# Patient Record
Sex: Male | Born: 1980 | Hispanic: No | Marital: Married | Smoking: Never smoker
Health system: Southern US, Community
[De-identification: ages and names within clinical notes are randomized; demographics above are authoritative.]

## PROBLEM LIST (undated history)

## (undated) DIAGNOSIS — N200 Calculus of kidney: Secondary | ICD-10-CM

---

## 2011-02-15 ENCOUNTER — Emergency Department: Payer: Self-pay | Admitting: Internal Medicine

## 2012-05-17 DIAGNOSIS — S83519A Sprain of anterior cruciate ligament of unspecified knee, initial encounter: Secondary | ICD-10-CM | POA: Insufficient documentation

## 2012-05-17 DIAGNOSIS — S83249A Other tear of medial meniscus, current injury, unspecified knee, initial encounter: Secondary | ICD-10-CM | POA: Insufficient documentation

## 2012-10-23 DIAGNOSIS — M25569 Pain in unspecified knee: Secondary | ICD-10-CM | POA: Insufficient documentation

## 2014-03-12 ENCOUNTER — Other Ambulatory Visit (HOSPITAL_BASED_OUTPATIENT_CLINIC_OR_DEPARTMENT_OTHER): Payer: Self-pay | Admitting: Physician Assistant

## 2014-03-12 ENCOUNTER — Ambulatory Visit (HOSPITAL_BASED_OUTPATIENT_CLINIC_OR_DEPARTMENT_OTHER)
Admission: RE | Admit: 2014-03-12 | Discharge: 2014-03-12 | Disposition: A | Discharge status: Home / Self Care | Payer: Federal, State, Local not specified - PPO | Source: Ambulatory Visit | Attending: Family Medicine | Admitting: Family Medicine

## 2014-03-12 DIAGNOSIS — N200 Calculus of kidney: Secondary | ICD-10-CM | POA: Diagnosis not present

## 2014-03-12 DIAGNOSIS — R1032 Left lower quadrant pain: Secondary | ICD-10-CM

## 2014-03-12 DIAGNOSIS — R933 Abnormal findings on diagnostic imaging of other parts of digestive tract: Secondary | ICD-10-CM | POA: Diagnosis not present

## 2014-03-12 MED ORDER — IOHEXOL 300 MG/ML  SOLN
100.0000 mL | Freq: Once | INTRAMUSCULAR | Status: AC | PRN
  Administered 2014-03-12: 100 mL via INTRAVENOUS

## 2014-03-12 MED ORDER — IOHEXOL 300 MG/ML  SOLN
50.0000 mL | Freq: Once | INTRAMUSCULAR | Status: AC | PRN
  Administered 2014-03-12: 50 mL via ORAL

## 2015-08-20 DIAGNOSIS — M2391 Unspecified internal derangement of right knee: Secondary | ICD-10-CM | POA: Insufficient documentation

## 2015-08-20 DIAGNOSIS — M25661 Stiffness of right knee, not elsewhere classified: Secondary | ICD-10-CM | POA: Insufficient documentation

## 2015-08-20 DIAGNOSIS — M1711 Unilateral primary osteoarthritis, right knee: Secondary | ICD-10-CM | POA: Insufficient documentation

## 2015-09-01 ENCOUNTER — Encounter (HOSPITAL_COMMUNITY): Payer: Self-pay | Admitting: Emergency Medicine

## 2015-09-01 ENCOUNTER — Emergency Department (HOSPITAL_COMMUNITY)
Admission: EM | Admit: 2015-09-01 | Discharge: 2015-09-01 | Disposition: A | Discharge status: Home / Self Care | Payer: Non-veteran care | Attending: Emergency Medicine | Admitting: Emergency Medicine

## 2015-09-01 DIAGNOSIS — R109 Unspecified abdominal pain: Secondary | ICD-10-CM | POA: Diagnosis present

## 2015-09-01 DIAGNOSIS — R1031 Right lower quadrant pain: Secondary | ICD-10-CM | POA: Insufficient documentation

## 2015-09-01 HISTORY — DX: Calculus of kidney: N20.0

## 2015-09-01 NOTE — ED Notes (Signed)
Bed: WA09 Expected date:  Expected time:  Means of arrival:  Comments: EMS 35 yo Flank pain / Hematuria

## 2015-09-01 NOTE — ED Provider Notes (Signed)
CSN: [id number]     Arrival date & time 09/01/15  6962 History   None    Chief Complaint  Patient presents with  . Flank Pain    HPI Comments: 35 year old male presents with R flank pain since 2:30AM last night. PMH significant for kidney stones. Reports associated fever, chills, N/V, hematuria. He is usually seen at the [place] where he states he is always treated and they have all his records. He has come here today because he was on his way to the [place] when the pain became so severe he began to vomit. EMS was called who transported him here. He states he would not like any work up here and just wants to make sure he is "stable" so he can go to the [place]. He is stating his pain is better and is not feeling nauseous anymore. Denies chest pain, SOB, upper abdominal pain, constipation/diarrhea, testicular pain or swelling.  Patient is a 35 y.o. male presenting with flank pain.  Flank Pain Associated symptoms include abdominal pain, chills, a fever, nausea and vomiting. Pertinent negatives include no chest pain.    Past Medical History  Diagnosis Date  . Kidney stones    History reviewed. No pertinent past surgical history. History reviewed. No pertinent family history. Social History  Substance Use Topics  . Smoking status: Never Smoker   . Smokeless tobacco: Never Used  . Alcohol Use: No    Review of Systems  Constitutional: Positive for fever and chills.  Respiratory: Negative for shortness of breath.   Cardiovascular: Negative for chest pain.  Gastrointestinal: Positive for nausea, vomiting and abdominal pain. Negative for diarrhea, constipation and blood in stool.  Genitourinary: Positive for hematuria and flank pain. Negative for dysuria, scrotal swelling and testicular pain.  All other systems reviewed and are negative.   Allergies  Review of patient's allergies indicates no known allergies.  Home Medications   Prior to Admission medications   Not on File   BP 128/84 mmHg   Pulse 60  Temp(Src) 97.6 F (36.4 C) (Oral)  Resp 20  SpO2 100%   Physical Exam  Constitutional: He is oriented to person, place, and time. He appears well-developed and well-nourished. No distress.  HENT:  Head: Normocephalic and atraumatic.  Eyes: Conjunctivae are normal. Pupils are equal, round, and reactive to light. Right eye exhibits no discharge. Left eye exhibits no discharge. No scleral icterus.  Neck: Normal range of motion.  Cardiovascular: Normal rate and regular rhythm.  Exam reveals no gallop and no friction rub.   No murmur heard. Pulmonary/Chest: Effort normal and breath sounds normal. No respiratory distress. He has no wheezes. He has no rales. He exhibits no tenderness.  Abdominal: Soft. Bowel sounds are normal. He exhibits no distension and no mass. There is tenderness. There is CVA tenderness. There is no rebound and no guarding.  R CVA tenderness, RLQ tenderness  Neurological: He is alert and oriented to person, place, and time.  Skin: Skin is warm and dry.  Psychiatric: He has a normal mood and affect.    ED Course  Procedures (including critical care time) Labs Review Labs Reviewed - No data to display  Imaging Review No results found. I have personally reviewed and evaluated these images and lab results as part of my medical decision-making.   EKG Interpretation None      MDM   Final diagnoses:  Right flank pain   35 year old male who presents with R flank pain  and symptoms consistent with a kidney stone. Patient is afebrile, normotensive. All other vitals are within normal limits. Patient's history and exam are suggestive of a kidney stone however patient is refusing further work up or medications. He is also having RLQ pain. I advised him that I am unable to tell him what exactly is causing his symptoms. We discussed the nature and purpose, risks and benefits, as well as, the alternatives of treatment. Time was given to allow the opportunity to ask  questions and consider their options, and after the discussion, the patient decided to refuse the offerred treatment. Patient has capacity to make their decision and understood that we would not be able to adequately diagnose him or give him a prognosis. I do not suspect that this is a life threatening cause as his vitals are stable and he looks non-toxic, therefore I do not think he has to leave AMA. Patient understood the consequences of leaving without treatment or further work up and still expressed wishes to leave. Patient is NAD, non-toxic, with stable VS. The patient was notified that they may return to the emergency department at any time for further treatment.       Bethel BornKelly Marie Cipriana Biller, PA-C 09/01/15 1202  Dione Boozeavid Glick, MD 09/01/15 [phone]

## 2015-09-01 NOTE — ED Notes (Signed)
Brought in by EMS from home with c/o flank pain.  Per EMS, pt reported that he started having right flank pain at around 0200 this morning.  Pt also reports nausea and vomiting earlier, and hematuria 3 days ago. Marland Kitchen.  Has hx of kidney stones.  Pt refused medications by EMS---- wanted "just to be stabilized and [facility]

## 2015-09-01 NOTE — Discharge Instructions (Signed)
Flank Pain °Flank pain refers to pain that is located on the side of the body between the upper abdomen and the back. The pain may occur over a short period of time (acute) or may be long-term or reoccurring (chronic). It may be mild or severe. Flank pain can be caused by many things. °CAUSES  °Some of the more common causes of flank pain include: °· Muscle strains.   °· Muscle spasms.   °· A disease of your spine (vertebral disk disease).   °· A lung infection (pneumonia).   °· Fluid around your lungs (pulmonary edema).   °· A kidney infection.   °· Kidney stones.   °· A very painful skin rash caused by the chickenpox virus (shingles).   °· Gallbladder disease.   °HOME CARE INSTRUCTIONS  °Home care will depend on the cause of your pain. In general, °· Rest as directed by your caregiver. °· Drink enough fluids to keep your urine clear or pale yellow. °· Only take over-the-counter or prescription medicines as directed by your caregiver. Some medicines may help relieve the pain. °· Tell your caregiver about any changes in your pain. °· Follow up with your caregiver as directed. °SEEK IMMEDIATE MEDICAL CARE IF:  °· Your pain is not controlled with medicine.   °· You have new or worsening symptoms. °· Your pain increases.   °· You have abdominal pain.   °· You have shortness of breath.   °· You have persistent nausea or vomiting.   °· You have swelling in your abdomen.   °· You feel faint or pass out.   °· You have blood in your urine. °· You have a fever or persistent symptoms for more than 2-3 days. °· You have a fever and your symptoms suddenly get worse. °MAKE SURE YOU:  °· Understand these instructions. °· Will watch your condition. °· Will get help right away if you are not doing well or get worse. °  °This information is not intended to replace advice given to you by your health care provider. Make sure you discuss any questions you have with your health care provider. °  °Document Released: 05/29/2005 Document  Revised: 12/31/2011 Document Reviewed: 11/20/2011 °Elsevier Interactive Patient Education ©2016 [employer] ° °

## 2016-11-13 ENCOUNTER — Ambulatory Visit: Payer: Self-pay

## 2016-11-13 ENCOUNTER — Encounter: Payer: Self-pay | Admitting: Podiatry

## 2016-11-13 ENCOUNTER — Ambulatory Visit (INDEPENDENT_AMBULATORY_CARE_PROVIDER_SITE_OTHER): Payer: Federal, State, Local not specified - PPO

## 2016-11-13 ENCOUNTER — Ambulatory Visit (INDEPENDENT_AMBULATORY_CARE_PROVIDER_SITE_OTHER): Payer: Federal, State, Local not specified - PPO | Admitting: Podiatry

## 2016-11-13 DIAGNOSIS — M722 Plantar fascial fibromatosis: Secondary | ICD-10-CM | POA: Diagnosis not present

## 2016-11-13 DIAGNOSIS — M21619 Bunion of unspecified foot: Secondary | ICD-10-CM

## 2016-11-13 DIAGNOSIS — L84 Corns and callosities: Secondary | ICD-10-CM | POA: Diagnosis not present

## 2016-11-13 DIAGNOSIS — M2142 Flat foot [pes planus] (acquired), left foot: Secondary | ICD-10-CM

## 2016-11-13 DIAGNOSIS — M2141 Flat foot [pes planus] (acquired), right foot: Secondary | ICD-10-CM | POA: Diagnosis not present

## 2016-11-13 MED ORDER — MELOXICAM 15 MG PO TABS: 15.0000 mg | ORAL_TABLET | Freq: Every day | ORAL | 2 refills | Status: AC

## 2016-11-13 NOTE — Progress Notes (Signed)
Subjective:    Patient ID: James Delacruz, male   DOB: 36 y.o.   MRN: [id number]   HPI James Delacruz Presents the also concerns her right heel pain which is been ongoing for about 1 month. He states his pain when he first gets S James Delacruz stands backup. He describes as a gradual onset. He does sit in symptoms did worsen 3 change stop having more of a sedentary job. He states that shortly after the pain started he started to have boot camp which does not hurt his foot. He states the pain doesn't get better with activity. No numbness or tingling. Pain does not wake him up at night. He has noticed a hard piece of skin from the ball of his right foot as well. He's had no recent treatment has been tried cutting it off himself. He has no other concerns.  Review of Systems  All other systems reviewed and are negative.       Objective:  Physical Exam General: AAO x3, NAD  Dermatological: Hyperkeratotic lesion present right foot submetatarsal 2. No surrounding edema, erythema, drainage or pus. There are no open sores, no preulcerative lesions, no rash or signs of infection present.  Vascular: Dorsalis Pedis artery and Posterior Tibial artery pedal pulses are 2/4 bilateral with immedate capillary fill time.  There is no pain with calf compression, swelling, warmth, erythema.   Neruologic: Grossly intact via light touch bilateral. Vibratory intact via tuning fork bilateral. Protective threshold with Semmes Wienstein monofilament intact to all pedal sites bilateral.   Musculoskeletal: Tenderness to palpation along the plantar medial tubercle of the calcaneus at the insertion of plantar fascia on the right foot. There is no pain along the course of the plantar fascia within the arch of the foot. Plantar fascia appears to be intact. There is no pain with lateral compression of the calcaneus or pain with vibratory sensation. There is no pain along the course or insertion of the achilles tendon. No other areas of tenderness to  bilateral lower extremities. 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 No pain, crepitus, or limitation noted with foot and ankle range of motion bilateral. Muscular strength 5/5 in all groups tested bilateral.  Gait: Unassisted, Nonantalgic.      Assessment:     36 year old male right heel pain/plantar fasciitis due to biomechanical changes    Plan:  -Treatment options discussed including all alternatives, risks, and complications -Etiology of symptoms were discussed -X-rays were obtained and reviewed with the patient. No significant calcaneal spurring is present. No evidence of acute fracture. -Declined steroid injection. -Prescribed mobic. Discussed side effects of the medication and directed to stop if any are to occur and call the office.  -Stretching, icing exercises discussed -Discussed shoe gear modifications and orthotics. He would like custom orthotics he was molded for them today. -Hopefully the inserts will help with the callus. Which surgery to the area daily. -Follow-up in 3 weeks or sooner if needed. Call any questions or concerns.  Ovid CurdMatthew Sharonda Llamas, DPM

## 2016-11-13 NOTE — Patient Instructions (Signed)

## 2016-11-13 NOTE — Progress Notes (Signed)
   Subjective:    Patient ID: James Delacruz, male    DOB: Nov 07, 1980, 36 y.o.   MRN: [id number]  HPI  I have some right heel pain and has been going on for a while and I have a callus on the bottom of my right foot and I have some coldness due to an injury back in [place]    Review of Systems  Constitutional: Positive for fatigue.  HENT: Positive for sinus pressure.   Gastrointestinal:       Blood in urine   Musculoskeletal: Positive for back pain and gait problem.  All other systems reviewed and are negative.      Objective:   Physical Exam        Assessment & Plan:

## 2016-11-14 ENCOUNTER — Encounter (HOSPITAL_COMMUNITY): Payer: Self-pay | Admitting: Emergency Medicine

## 2016-12-09 ENCOUNTER — Other Ambulatory Visit: Payer: Federal, State, Local not specified - PPO | Admitting: Orthotics

## 2016-12-11 ENCOUNTER — Other Ambulatory Visit: Payer: Self-pay | Admitting: Orthotics

## 2018-01-01 ENCOUNTER — Other Ambulatory Visit: Payer: Self-pay | Admitting: Internal Medicine

## 2018-01-01 DIAGNOSIS — R9389 Abnormal findings on diagnostic imaging of other specified body structures: Secondary | ICD-10-CM

## 2018-01-01 DIAGNOSIS — J189 Pneumonia, unspecified organism: Secondary | ICD-10-CM

## 2018-01-01 DIAGNOSIS — J181 Lobar pneumonia, unspecified organism: Secondary | ICD-10-CM

## 2018-01-11 ENCOUNTER — Other Ambulatory Visit: Payer: Self-pay | Admitting: Podiatry

## 2018-01-11 DIAGNOSIS — M79672 Pain in left foot: Secondary | ICD-10-CM

## 2018-01-13 ENCOUNTER — Ambulatory Visit
Admission: RE | Admit: 2018-01-13 | Discharge: 2018-01-13 | Disposition: A | Discharge status: Home / Self Care | Payer: Federal, State, Local not specified - PPO | Source: Ambulatory Visit | Attending: Podiatry | Admitting: Podiatry

## 2018-01-13 DIAGNOSIS — M79672 Pain in left foot: Secondary | ICD-10-CM

## 2018-10-06 ENCOUNTER — Other Ambulatory Visit: Payer: Self-pay

## 2018-10-06 DIAGNOSIS — R109 Unspecified abdominal pain: Secondary | ICD-10-CM

## 2019-01-11 ENCOUNTER — Telehealth: Payer: Self-pay | Admitting: *Deleted
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
## 2019-01-13 NOTE — Telephone Encounter (Signed)
I informed pt Dr. Jacqualyn Posey stated pt should be evaluated again since last office visit was 2018. Transferred pt to schedulers.

## 2019-02-11 ENCOUNTER — Other Ambulatory Visit: Payer: Self-pay

## 2019-02-11 ENCOUNTER — Ambulatory Visit: Payer: Federal, State, Local not specified - PPO | Admitting: Podiatry

## 2019-02-11 ENCOUNTER — Encounter: Payer: Self-pay | Admitting: Podiatry

## 2019-02-11 ENCOUNTER — Ambulatory Visit: Payer: Federal, State, Local not specified - PPO

## 2019-02-11 DIAGNOSIS — M722 Plantar fascial fibromatosis: Secondary | ICD-10-CM

## 2019-02-11 NOTE — Progress Notes (Signed)
Subjective: 38 year old male presents the office today requesting a letter from medical clinic notes to submit to the department of [facility] [facility] for plantar fasciitis and pes planus.  While he was in [employer] he reports he had several injuries to his feet and ankle sprains.  He feels this is what contributed to his plan fasciitis.  Although he had flatfeet prior to this the symptoms were exacerbated during his [employer].  After I last saw him he did follow-up with Dr. Kayleen Memos in [place], [place] where he had steroid injections as well as stretching, orthotics.  Despite this he is continued have pain.  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  He does not want any other treatments today and only is requesting a letter. Denies any systemic complaints such as fevers, chills, nausea, vomiting. No acute changes since last appointment, and no other complaints at this time.   Objective: AAO x3, NAD DP/PT pulses palpable bilaterally, CRT less than 3 seconds Suggested there is tenderness on the plantar medial tubercle of the calcaneus at insertion upon the fascia.  Crepitus present.  No area pinpoint tenderness.  Ankle, subtalar range of motion intact.  No pain with calf compression, swelling, warmth, erythema  Assessment: Chronic bilateral foot pain  Plan: -All treatment options discussed with the patient including all alternatives, risks, complications.  -Note was provided for him today.  He declined any other further treatment today and declined x-rays. -Patient encouraged to call the office with any questions, concerns, change in symptoms.   Trula Slade DPM

## 2019-02-25 ENCOUNTER — Encounter: Payer: Self-pay | Admitting: Podiatry

## 2019-02-25 NOTE — Progress Notes (Signed)
Requested medical records were placed up front to be mailed out to patient per his request.

## 2019-06-26 ENCOUNTER — Ambulatory Visit
Admission: EM | Admit: 2019-06-26 | Discharge: 2019-06-26 | Disposition: A | Discharge status: Home / Self Care | Payer: Federal, State, Local not specified - PPO | Attending: Physician Assistant | Admitting: Physician Assistant

## 2019-06-26 ENCOUNTER — Ambulatory Visit (INDEPENDENT_AMBULATORY_CARE_PROVIDER_SITE_OTHER): Payer: Federal, State, Local not specified - PPO

## 2019-06-26 ENCOUNTER — Encounter: Payer: Self-pay | Admitting: Physician Assistant

## 2019-06-26 ENCOUNTER — Other Ambulatory Visit: Payer: Self-pay

## 2019-06-26 DIAGNOSIS — M25572 Pain in left ankle and joints of left foot: Secondary | ICD-10-CM

## 2019-06-26 DIAGNOSIS — Y9302 Activity, running: Secondary | ICD-10-CM

## 2019-06-26 DIAGNOSIS — S01111A Laceration without foreign body of right eyelid and periocular area, initial encounter: Secondary | ICD-10-CM | POA: Diagnosis not present

## 2019-06-26 DIAGNOSIS — S0181XA Laceration without foreign body of other part of head, initial encounter: Secondary | ICD-10-CM | POA: Diagnosis not present

## 2019-06-26 DIAGNOSIS — W228XXA Striking against or struck by other objects, initial encounter: Secondary | ICD-10-CM

## 2019-06-26 DIAGNOSIS — W010XXA Fall on same level from slipping, tripping and stumbling without subsequent striking against object, initial encounter: Secondary | ICD-10-CM
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
## 2019-06-26 NOTE — Discharge Instructions (Signed)
Xray to the ankle with possible avulsion fracture, this usually means you sprained your ankle.  Start ibuprofen 400 mg 3 times a day for the next 5 to 7 days.  Ice compress, elevation, ankle brace during activity, rest.  Follow-up with orthopedics for further evaluation if symptoms not improving in the next week.  3sutures placed. You can remove current dressing in 24 hours. Keep wound clean and dry. You can clean gently with soap and water. Do not soak area in water. Monitor for spreading redness, increased warmth, increased swelling, fever, follow up for reevaluation needed. Otherwise follow up in 5 days for suture removal.
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
## 2019-07-01 ENCOUNTER — Ambulatory Visit
Admission: EM | Admit: 2019-07-01 | Discharge: 2019-07-01 | Disposition: A | Discharge status: Home / Self Care | Payer: Federal, State, Local not specified - PPO

## 2019-07-01 ENCOUNTER — Other Ambulatory Visit: Payer: Self-pay

## 2019-07-01 DIAGNOSIS — Z4802 Encounter for removal of sutures: Secondary | ICD-10-CM

## 2019-07-01 NOTE — ED Triage Notes (Signed)
Pt presents for suture removal from right eye brow. 3 sutures removed, site is clean and intact.  Pt complains of worsening left ankle pain from injury he was seen here for on 3/7. Verbal order for crutches and ace wrap from [employer] PA. Pt will follow up with ortho.

## 2020-04-27 ENCOUNTER — Other Ambulatory Visit: Payer: Self-pay

## 2020-04-27 ENCOUNTER — Ambulatory Visit
Admission: EM | Admit: 2020-04-27 | Discharge: 2020-04-27 | Disposition: A | Discharge status: Home / Self Care | Payer: Federal, State, Local not specified - PPO | Attending: Emergency Medicine | Admitting: Emergency Medicine

## 2020-04-27 DIAGNOSIS — J029 Acute pharyngitis, unspecified: Secondary | ICD-10-CM | POA: Insufficient documentation

## 2020-04-27 LAB — POCT RAPID STREP A (OFFICE): Rapid Strep A Screen: NEGATIVE

## 2020-04-27 NOTE — ED Triage Notes (Signed)
Patient presents to Urgent Care with complaints of bilateral ear pain that progressed into sore throat since Tuesday night. Patient is unvaccinated.

## 2020-04-27 NOTE — ED Provider Notes (Signed)
[facility]    CSN: [id number] Arrival date & time: 04/27/20  0840      History   Chief Complaint No chief complaint on file. Ear Pain, sore throat  HPI James Delacruz is a 40 y.o. male presenting today for evaluation of ear pain and sore throat.  Patient reports that over the past 3 to 4 days he has had ear pain and sore throat.  Reports increased sore throat and discomfort over the past 1 to 2 days.  Symptoms worse on the left side.  Reports very mild cough.  Denies significant rhinorrhea or congestion.  Denies fevers or fatigue.  HPI  Past Medical History:  Diagnosis Date  . Kidney stones     Patient Active Problem List   Diagnosis Date Noted  . Plantar fasciitis 11/13/2016  . Internal derangement of right knee 08/20/2015  . Primary osteoarthritis of right knee 08/20/2015  . Pain in unspecified knee 10/23/2012  . ACL tear 05/17/2012  . Medial meniscus tear 05/17/2012    History reviewed. No pertinent surgical history.     Home Medications    Prior to Admission medications   Medication Sig Start Date End Date Taking? Authorizing Provider  tamsulosin (FLOMAX) 0.4 MG CAPS capsule Take 0.4 mg by mouth daily. 01/25/19   [provider]    Family History History reviewed. No pertinent family history.  Social History Social History   Tobacco Use  . Smoking status: Unknown If Ever Smoked  . Smokeless tobacco: Never Used  Substance Use Topics  . Alcohol use: No     Allergies   Patient has no known allergies.   Review of Systems Review of Systems  Constitutional: Negative for activity change, appetite change, chills, fatigue and fever.  HENT: Positive for ear pain and sore throat. Negative for congestion, rhinorrhea, sinus pressure and trouble swallowing.   Eyes: Negative for discharge and redness.  Respiratory: Positive for cough. Negative for chest tightness and shortness of breath.   Cardiovascular: Negative for chest pain.   Gastrointestinal: Negative for abdominal pain, diarrhea, nausea and vomiting.  Musculoskeletal: Negative for myalgias.  Skin: Negative for rash.  Neurological: Negative for dizziness, light-headedness and headaches.     Physical Exam Triage Vital Signs ED Triage Vitals  Enc Vitals Group     BP 04/27/20 0910 126/86     Pulse Rate 04/27/20 0910 90     Resp 04/27/20 0910 17     Temp 04/27/20 0911 99.7 F (37.6 C)     Temp Source 04/27/20 0910 Oral     SpO2 04/27/20 0910 96 %     Weight 04/27/20 0911 194 lb (88 kg)     Height 04/27/20 0911 6\' 1"  (1.854 m)     Head Circumference --      Peak Flow --      Pain Score 04/27/20 0911 6     Pain Loc --      Pain Edu? --      Excl. in GC? --    No data found.  Updated Vital Signs BP 126/86 (BP Location: Right Arm)   Pulse 90   Temp 99.7 F (37.6 C)   Resp 17   Ht 6\' 1"  (1.854 m)   Wt 194 lb (88 kg)   SpO2 96%   BMI 25.60 kg/m   Visual Acuity Right Eye Distance:   Left Eye Distance:   Bilateral Distance:    Right Eye Near:   Left Eye Near:  Bilateral Near:     Physical Exam Vitals and nursing note reviewed.  Constitutional:      Appearance: He is well-developed and well-nourished.     Comments: No acute distress  HENT:     Head: Normocephalic and atraumatic.     Ears:     Comments: Bilateral ears without tenderness to palpation of external auricle, tragus and mastoid, EAC's without erythema or swelling, TM's with good bony landmarks and cone of light. Non erythematous.     Nose: Nose normal.     Mouth/Throat:     Comments: Oral mucosa pink and moist, no tonsillar enlargement or exudate. Posterior pharynx patent and is erythematous, no uvula deviation or swelling. Normal phonation. Eyes:     Conjunctiva/sclera: Conjunctivae normal.  Neck:     Comments: Left tonsillar lymphadenopathy Cardiovascular:     Rate and Rhythm: Normal rate.  Pulmonary:     Effort: Pulmonary effort is normal. No respiratory distress.      Comments: Breathing comfortably at rest, CTABL, no wheezing, rales or other adventitious sounds auscultated Abdominal:     General: There is no distension.  Musculoskeletal:        General: Normal range of motion.     Cervical back: Neck supple.  Skin:    General: Skin is warm and dry.  Neurological:     Mental Status: He is alert and oriented to person, place, and time.  Psychiatric:        Mood and Affect: Mood and affect normal.      UC Treatments / Results  Labs (all labs ordered are listed, but only abnormal results are displayed) Labs Reviewed  CULTURE, GROUP A STREP [facility])  POCT RAPID STREP A (OFFICE)    EKG   Radiology No results found.  Procedures Procedures (including critical care time)  Medications Ordered in UC Medications - No data to display  Initial Impression / Assessment and Plan / UC Course  I have reviewed the triage vital signs and the nursing notes.  Pertinent labs & imaging results that were available during my care of the patient were reviewed by me and considered in my medical decision making (see chart for details).     Strep test negative, strep culture pending, patient declined Covid testing.  Recommending symptomatic and supportive care suspect likely viral etiology.  Rest and fluids.  Discussed strict return precautions. Patient verbalized understanding and is agreeable with plan.  Final Clinical Impressions(s) / UC Diagnoses   Final diagnoses:  Sore throat     Discharge Instructions     Sore Throat  Your rapid strep tested Negative today. We will send for a culture and call in about 2 days if results are positive.  Sore throat likely viral related to postnasal drainage.  Please continue Tylenol or Ibuprofen for fever and pain. May try salt water gargles, cepacol lozenges, throat spray, or OTC cold relief medicine for throat discomfort. If you also have congestion take a daily anti-histamine like Zyrtec, Claritin, and a  oral decongestant to help with post nasal drip that may be irritating your throat.   Stay hydrated and drink plenty of fluids to keep your throat coated relieve irritation.     ED Prescriptions    None     PDMP not reviewed this encounter.   Wieters, [place] C, PA-C 04/27/20 1003

## 2020-04-27 NOTE — Discharge Instructions (Signed)
Sore Throat  Your rapid strep tested Negative today. We will send for a culture and call in about 2 days if results are positive.  Sore throat likely viral related to postnasal drainage.  Please continue Tylenol or Ibuprofen for fever and pain. May try salt water gargles, cepacol lozenges, throat spray, or OTC cold relief medicine for throat discomfort. If you also have congestion take a daily anti-histamine like Zyrtec, Claritin, and a oral decongestant to help with post nasal drip that may be irritating your throat.   Stay hydrated and drink plenty of fluids to keep your throat coated relieve irritation.

## 2020-04-30 LAB — CULTURE, GROUP A STREP (THRC)

## 2020-05-16 ENCOUNTER — Ambulatory Visit
Admission: EM | Admit: 2020-05-16 | Discharge: 2020-05-16 | Disposition: A | Discharge status: Home / Self Care | Payer: Federal, State, Local not specified - PPO | Attending: Urgent Care | Admitting: Urgent Care

## 2020-05-16 ENCOUNTER — Other Ambulatory Visit: Payer: Self-pay

## 2020-05-16 ENCOUNTER — Encounter: Payer: Self-pay | Admitting: Emergency Medicine

## 2020-05-16 ENCOUNTER — Ambulatory Visit (INDEPENDENT_AMBULATORY_CARE_PROVIDER_SITE_OTHER): Payer: Federal, State, Local not specified - PPO

## 2020-05-16 DIAGNOSIS — J189 Pneumonia, unspecified organism: Secondary | ICD-10-CM

## 2020-05-16 DIAGNOSIS — R059 Cough, unspecified: Secondary | ICD-10-CM

## 2020-05-16 MED ORDER — BENZONATATE 100 MG PO CAPS: 100.0000 mg | ORAL_CAPSULE | Freq: Three times a day (TID) | ORAL | 0 refills | Status: DC | PRN

## 2020-05-16 MED ORDER — CEFDINIR 300 MG PO CAPS: 300.0000 mg | ORAL_CAPSULE | Freq: Two times a day (BID) | ORAL | 0 refills | Status: DC

## 2020-05-16 MED ORDER — PROMETHAZINE-DM 6.25-15 MG/5ML PO SYRP: 5.0000 mL | ORAL_SOLUTION | Freq: Every evening | ORAL | 0 refills | Status: DC | PRN

## 2020-05-16 MED ORDER — AZITHROMYCIN 250 MG PO TABS: ORAL_TABLET | ORAL | 0 refills | Status: DC

## 2020-05-16 NOTE — ED Provider Notes (Signed)
  [facility]   MRN: [id number] DOB: 03/18/1981  Subjective:   James Delacruz is a 40 y.o. male presenting for 1 month history of persistent cough, bilateral ear discomfort, scratchy sore throat. Had a negative strep test ~2 weeks ago, negative throat culture. No smoking. History of bronchitis. No lung disorders otherwise. No COVID vaccination.   No current facility-administered medications for this encounter.  Current Outpatient Medications:  .  tamsulosin (FLOMAX) 0.4 MG CAPS capsule, Take 0.4 mg by mouth daily. (Patient not taking: Reported on 05/16/2020), Disp: , Rfl:    No Known Allergies  Past Medical History:  Diagnosis Date  . Kidney stones      History reviewed. No pertinent surgical history.  History reviewed. No pertinent family history.  Social History   Tobacco Use  . Smoking status: Unknown If Ever Smoked  . Smokeless tobacco: Never Used  Substance Use Topics  . Alcohol use: No    ROS   Objective:   Vitals: BP 130/87 (BP Location: Left Arm)   Pulse 87   Temp 98.8 F (37.1 C) (Oral)   Resp 18   SpO2 98%   Physical Exam Constitutional:      General: He is not in acute distress.    Appearance: Normal appearance. He is well-developed. He is not ill-appearing, toxic-appearing or diaphoretic.  HENT:     Head: Normocephalic and atraumatic.     Right Ear: External ear normal.     Left Ear: External ear normal.     Nose: Nose normal.     Mouth/Throat:     Mouth: Mucous membranes are moist.     Pharynx: Oropharynx is clear.  Eyes:     General: No scleral icterus.    Extraocular Movements: Extraocular movements intact.     Pupils: Pupils are equal, round, and reactive to light.  Cardiovascular:     Rate and Rhythm: Normal rate and regular rhythm.     Heart sounds: Normal heart sounds. No murmur heard. No friction rub. No gallop.   Pulmonary:     Effort: Pulmonary effort is normal. No respiratory distress.     Breath sounds: Normal  breath sounds. No stridor. No wheezing, rhonchi or rales.  Neurological:     Mental Status: He is alert and oriented to person, place, and time.  Psychiatric:        Mood and Affect: Mood normal.        Behavior: Behavior normal.        Thought Content: Thought content normal.     DG Chest 2 View  Result Date: 05/16/2020 CLINICAL DATA:  Productive cough EXAM: CHEST - 2 VIEW COMPARISON:  None. FINDINGS: Cardiac and mediastinal contours normal. Vascularity normal. No pleural effusion Question early infiltrate right upper lobe.  Left lung clear. IMPRESSION: Possible early pneumonia right upper lobe. Electronically Signed   By: Marlan Palau M.D.   On: 05/16/2020 10:30   Assessment and Plan :   PDMP not reviewed this encounter.  1. Community acquired pneumonia of right upper lobe of lung   2. Cough     COVID 19 testing pending. Will cover for CAP with azithromycin, cefdinir. Use supportive care otherwise. Counseled patient on potential for adverse effects with medications prescribed/recommended today, ER and return-to-clinic precautions discussed, patient verbalized understanding.    Wallis Bamberg, PA-C 05/16/20 1043

## 2020-05-16 NOTE — ED Triage Notes (Signed)
Pt here for cough x 1 month; pt seen here 1 month ago for sore throat and ear itching; pt sts is improved but still having cough worse at night

## 2020-08-26 ENCOUNTER — Encounter: Payer: Self-pay | Admitting: *Deleted

## 2020-08-26 ENCOUNTER — Ambulatory Visit
Admission: EM | Admit: 2020-08-26 | Discharge: 2020-08-26 | Disposition: A | Discharge status: Home / Self Care | Payer: Federal, State, Local not specified - PPO | Attending: Emergency Medicine | Admitting: Emergency Medicine

## 2020-08-26 ENCOUNTER — Other Ambulatory Visit: Payer: Self-pay

## 2020-08-26 DIAGNOSIS — J111 Influenza due to unidentified influenza virus with other respiratory manifestations: Secondary | ICD-10-CM | POA: Diagnosis not present

## 2020-08-26 DIAGNOSIS — Z20822 Contact with and (suspected) exposure to covid-19: Secondary | ICD-10-CM | POA: Diagnosis not present

## 2020-08-26 LAB — POCT RAPID STREP A (OFFICE): Rapid Strep A Screen: NEGATIVE

## 2020-08-26 MED ORDER — IBUPROFEN 600 MG PO TABS: 600.0000 mg | ORAL_TABLET | Freq: Four times a day (QID) | ORAL | 0 refills | Status: DC | PRN

## 2020-08-26 MED ORDER — BENZONATATE 200 MG PO CAPS: 200.0000 mg | ORAL_CAPSULE | Freq: Three times a day (TID) | ORAL | 0 refills | Status: DC | PRN

## 2020-08-26 MED ORDER — FLUTICASONE PROPIONATE 50 MCG/ACT NA SUSP: 2.0000 | Freq: Every day | NASAL | 0 refills | Status: DC

## 2020-08-26 NOTE — ED Provider Notes (Signed)
HPI  SUBJECTIVE:  James Delacruz is a 40 y.o. male who presents with 4 days of sinus pain, nasal congestion, rhinorrhea, cold sweats, body aches, headaches, sore throat.  He states that he saw a "white spot" on his tonsils yesterday, but this has since resolved.  He reports cough, postnasal drip.  He states he has some diarrhea yesterday, but attributes this to some bad soup.  No fevers, loss of sense of smell or taste, shortness of breath, nausea, vomiting, abdominal pain.  No known COVID exposure.  He was exposed to flu 5 days ago.  He did not get either vaccine.  No allergy symptoms.  No antipyretic in the past 6 hours.  He tried 650 mg of Tylenol every 4 hours, Sudafed and Tylenol Cold and flu without improvement in his symptoms.  No aggravating factors.  Past medical history negative for diabetes, hypertension, pulmonary disease.  PMD: [facility].    Past Medical History:  Diagnosis Date  . Kidney stones     History reviewed. No pertinent surgical history.  Family History  Problem Relation Age of Onset  . Healthy Mother   . Healthy Father     Social History   Tobacco Use  . Smoking status: Never Smoker  . Smokeless tobacco: Never Used  Vaping Use  . Vaping Use: Never used  Substance Use Topics  . Alcohol use: No  . Drug use: Never    No current facility-administered medications for this encounter.  Current Outpatient Medications:  .  benzonatate (TESSALON) 200 MG capsule, Take 1 capsule (200 mg total) by mouth 3 (three) times daily as needed for cough., Disp: 30 capsule, Rfl: 0 .  fluticasone (FLONASE) 50 MCG/ACT nasal spray, Place 2 sprays into both nostrils daily., Disp: 16 g, Rfl: 0 .  ibuprofen (ADVIL) 600 MG tablet, Take 1 tablet (600 mg total) by mouth every 6 (six) hours as needed., Disp: 30 tablet, Rfl: 0  No Known Allergies   ROS  As noted in HPI.   Physical Exam  BP 119/81   Pulse 75   Temp 97.9 F (36.6 C) (Temporal)   Resp 18   SpO2 96%    Constitutional: Well developed, well nourished, no acute distress Eyes:  EOMI, conjunctiva normal bilaterally HENT: Normocephalic, atraumatic,mucus membranes moist positive nasal congestion, erythematous, swollen turbinates.  No maxillary, frontal sinus tenderness.  Erythematous oropharynx, tonsils normal size without exudates.  No obvious postnasal drip.  Uvula midline. Neck: Positive anterior cervical lymphadenopathy Respiratory: Normal inspiratory effort, lungs clear bilaterally Cardiovascular: Normal rate regular rhythm no murmurs GI: nondistended nontender, no splenomegaly skin: No rash, skin intact Musculoskeletal: no deformities Neurologic: Alert & oriented x 3, no focal neuro deficits Psychiatric: Speech and behavior appropriate   ED Course   Medications - No data to display  Orders Placed This Encounter  Procedures  . Culture, group A strep    Standing Status:   Standing    Number of Occurrences:   1  . Covid-19, Flu A+B ([facility])    Standing Status:   Standing    Number of Occurrences:   1  . POCT rapid strep A    Standing Status:   Standing    Number of Occurrences:   1    Results for orders placed or performed during the hospital encounter of 08/26/20 (from the past 24 hour(s))  POCT rapid strep A     Status: None   Collection Time: 08/26/20  8:34 AM  Result Value Ref  Range   Rapid Strep A Screen Negative Negative   No results found.  ED Clinical Impression  1. Influenza-like illness   2. Encounter for laboratory testing for COVID-19 virus      ED Assessment/Plan  Suspect flu.  He is out of the window for Tamiflu.  Strep is negative.  Do not think that this is strep, so culture was not done.  COVID/flu sent.  Home with Tylenol/ibuprofen, Flonase, Mucinex D, Tessalon.  Stop all other cold medications.  Follow-up with PMD as needed, to the ER if he gets worse.  Discussed labs MDM, treatment plan, and plan for follow-up with patient. Discussed sn/sx that  should prompt return to the ED. patient agrees with plan.   Meds ordered this encounter  Medications  . benzonatate (TESSALON) 200 MG capsule    Sig: Take 1 capsule (200 mg total) by mouth 3 (three) times daily as needed for cough.    Dispense:  30 capsule    Refill:  0  . ibuprofen (ADVIL) 600 MG tablet    Sig: Take 1 tablet (600 mg total) by mouth every 6 (six) hours as needed.    Dispense:  30 tablet    Refill:  0  . fluticasone (FLONASE) 50 MCG/ACT nasal spray    Sig: Place 2 sprays into both nostrils daily.    Dispense:  16 g    Refill:  0      *This clinic note was created using Scientist, clinical (histocompatibility and immunogenetics). Therefore, there may be occasional mistakes despite careful proofreading.  ?    Domenick Gong, MD 08/27/20 [phone]

## 2020-08-26 NOTE — ED Triage Notes (Addendum)
C/O cough, nasal congestion, sinus pressure, chills x 2 days without known fevers.  Had body aches yesterday, but has since resolved.  Also reports slight sore throat with whitish lesion to left side of throat. Reports exposure to flu.

## 2020-08-26 NOTE — Discharge Instructions (Addendum)
Your strep was negative.  Your COVID and flu will be back in a day or 2.  Take 600 mg of ibuprofen combined with 1000 mg of Tylenol 3-4 times a day as needed, Mucinex D, Flonase, saline nasal irrigation with a [employer]Med sinus rinse and distilled water as often as you want.  Tessalon for the cough.  Stop other cold medications

## 2020-08-27 LAB — COVID-19, FLU A+B NAA
Influenza A, NAA: DETECTED — AB
Influenza B, NAA: NOT DETECTED
SARS-CoV-2, NAA: NOT DETECTED

## 2020-08-28 ENCOUNTER — Telehealth (HOSPITAL_COMMUNITY): Payer: Self-pay | Admitting: Emergency Medicine

## 2020-08-28 NOTE — Telephone Encounter (Signed)
Patient flu a positive.  Called patient and notified him of results.  He states that he is feeling better, however his daughter is sick with similar symptoms.  Advised him to take her for evaluation if she is not better in a day or 2. AM

## 2020-08-29 LAB — CULTURE, GROUP A STREP (THRC)

## 2021-04-13 ENCOUNTER — Emergency Department (INDEPENDENT_AMBULATORY_CARE_PROVIDER_SITE_OTHER)
Admission: EM | Admit: 2021-04-13 | Discharge: 2021-04-13 | Disposition: A | Discharge status: Home / Self Care | Payer: Federal, State, Local not specified - PPO | Source: Home / Self Care | Attending: Family Medicine | Admitting: Family Medicine

## 2021-04-13 DIAGNOSIS — B9689 Other specified bacterial agents as the cause of diseases classified elsewhere: Secondary | ICD-10-CM

## 2021-04-13 DIAGNOSIS — J329 Chronic sinusitis, unspecified: Secondary | ICD-10-CM | POA: Diagnosis not present

## 2021-04-13 LAB — POCT RAPID STREP A (OFFICE): Rapid Strep A Screen: NEGATIVE

## 2021-04-13 MED ORDER — AMOXICILLIN 875 MG PO TABS: 875.0000 mg | ORAL_TABLET | Freq: Two times a day (BID) | ORAL | 0 refills | Status: DC

## 2021-04-13 NOTE — ED Triage Notes (Signed)
Pt states that he has a cough, scratchy throat and some ear irritation. X2-3 days  Pt states that he isn't vaccinated for covid. Pt states that he hasn't had flu vaccine.

## 2021-04-13 NOTE — Discharge Instructions (Signed)
May continue over-the-counter medicine such as Mucinex DM, and Flonase.  This may help with the drainage of the sinus congestion Take antibiotic 2 times a day.  Take 2 doses today Call for problems

## 2021-04-13 NOTE — ED Provider Notes (Signed)
[facility]    CSN: [id number] Arrival date & time: 04/13/21  0908      History   Chief Complaint Chief Complaint  Patient presents with   Cough    Cough, scratchy throat, and ear irritation. X2-3 days    HPI James Delacruz is a 40 y.o. male.   HPI  Patient states that he has not been sick.  He has had a cough cold and scratchy throat for the last 2 to 3 days.  Prior to this he states he had a different infection.  He states he feels like he has been sick for a couple of weeks.  He is not vaccinated for COVID or flu.  No known exposure to flu or COVID.  He is here to find out if he has anything contagious prior to visiting family on Christmas tomorrow  Past Medical History:  Diagnosis Date   Kidney stones     Patient Active Problem List   Diagnosis Date Noted   Plantar fasciitis 11/13/2016   Internal derangement of right knee 08/20/2015   Primary osteoarthritis of right knee 08/20/2015   Pain in unspecified knee 10/23/2012   ACL tear 05/17/2012   Medial meniscus tear 05/17/2012    History reviewed. No pertinent surgical history.     Home Medications    Prior to Admission medications   Medication Sig Start Date End Date Taking? Authorizing Provider  Acetaminophen (TYLENOL 8 HOUR PO) Take by mouth.   Yes [provider]  amoxicillin (AMOXIL) 875 MG tablet Take 1 tablet (875 mg total) by mouth 2 (two) times daily. 04/13/21  Yes Eustace Moore, MD  fluticasone [facility]) 50 MCG/ACT nasal spray Place 2 sprays into both nostrils daily. 08/26/20   Domenick Gong, MD    Family History Family History  Problem Relation Age of Onset   Healthy Mother    Healthy Father     Social History Social History   Tobacco Use   Smoking status: Never   Smokeless tobacco: Never  Vaping Use   Vaping Use: Never used  Substance Use Topics   Alcohol use: No   Drug use: Never     Allergies   Patient has no known allergies.   Review of  Systems Review of Systems See HPI  Physical Exam Triage Vital Signs ED Triage Vitals  Enc Vitals Group     BP 04/13/21 1023 125/86     Pulse Rate 04/13/21 1023 71     Resp 04/13/21 1023 18     Temp 04/13/21 1023 98.5 F (36.9 C)     Temp Source 04/13/21 1023 Oral     SpO2 04/13/21 1023 97 %     Weight 04/13/21 1020 205 lb (93 kg)     Height 04/13/21 1020 6\' 1"  (1.854 m)     Head Circumference --      Peak Flow --      Pain Score 04/13/21 1020 7     Pain Loc --      Pain Edu? --      Excl. in GC? --    No data found.  Updated Vital Signs BP 125/86 (BP Location: Left Arm)    Pulse 71    Temp 98.5 F (36.9 C) (Oral)    Resp 18    Ht 6\' 1"  (1.854 m)    Wt 93 kg    SpO2 97%    BMI 27.05 kg/m     Physical Exam Constitutional:  General: He is not in acute distress.    Appearance: He is well-developed.  HENT:     Head: Normocephalic and atraumatic.     Right Ear: Tympanic membrane and ear canal normal.     Left Ear: Tympanic membrane and ear canal normal.     Nose: No congestion or rhinorrhea.     Mouth/Throat:     Mouth: Mucous membranes are moist.     Pharynx: Posterior oropharyngeal erythema present.  Eyes:     Conjunctiva/sclera: Conjunctivae normal.     Pupils: Pupils are equal, round, and reactive to light.  Cardiovascular:     Rate and Rhythm: Normal rate and regular rhythm.  Pulmonary:     Effort: Pulmonary effort is normal. No respiratory distress.     Breath sounds: Normal breath sounds.  Abdominal:     General: There is no distension.     Palpations: Abdomen is soft.  Musculoskeletal:        General: Normal range of motion.     Cervical back: Normal range of motion.  Lymphadenopathy:     Cervical: No cervical adenopathy.  Skin:    General: Skin is warm and dry.  Neurological:     Mental Status: He is alert.  Psychiatric:        Mood and Affect: Mood normal.        Behavior: Behavior normal.     UC Treatments / Results  Labs (all labs  ordered are listed, but only abnormal results are displayed) Labs Reviewed  POCT RAPID STREP A (OFFICE) - Normal    EKG   Radiology No results found.  Procedures Procedures (including critical care time)  Medications Ordered in UC Medications - No data to display  Initial Impression / Assessment and Plan / UC Course  I have reviewed the triage vital signs and the nursing notes.  Pertinent labs & imaging results that were available during my care of the patient were reviewed by me and considered in my medical decision making (see chart for details).    Final Clinical Impressions(s) / UC Diagnoses   Final diagnoses:  Sinusitis, bacterial     Discharge Instructions      May continue over-the-counter medicine such as Mucinex DM, and Flonase.  This may help with the drainage of the sinus congestion Take antibiotic 2 times a day.  Take 2 doses today Call for problems   ED Prescriptions     Medication Sig Dispense Auth. Provider   amoxicillin (AMOXIL) 875 MG tablet Take 1 tablet (875 mg total) by mouth 2 (two) times daily. 14 tablet Eustace Moore, MD      PDMP not reviewed this encounter.   Eustace Moore, MD 04/13/21 [phone]

## 2021-05-02 ENCOUNTER — Other Ambulatory Visit: Payer: Self-pay

## 2021-05-02 ENCOUNTER — Emergency Department
Admission: EM | Admit: 2021-05-02 | Discharge: 2021-05-02 | Disposition: A | Discharge status: Home / Self Care | Payer: Federal, State, Local not specified - PPO | Source: Home / Self Care | Attending: Family Medicine | Admitting: Family Medicine

## 2021-05-02 DIAGNOSIS — J0100 Acute maxillary sinusitis, unspecified: Secondary | ICD-10-CM

## 2021-05-02 MED ORDER — AMOXICILLIN-POT CLAVULANATE 875-125 MG PO TABS: 1.0000 | ORAL_TABLET | Freq: Two times a day (BID) | ORAL | 0 refills | Status: DC

## 2021-05-02 NOTE — ED Provider Notes (Signed)
[facility]    CSN: [id number] Arrival date & time: 05/02/21  0802      History   Chief Complaint Chief Complaint  Patient presents with   Facial Pain    left    HPI James Delacruz is a 41 y.o. male.   HPI  Patient is here for sudden onset of sinus symptoms.  He states yesterday he woke up and felt "like I had been punched in the face".  Points to his left cheek area.  States that the pain is in his cheek and radiates down to the back molars.  Felt a little nasal congestion.  No fever or chills, headache or body aches.  No sore throat.  He recently was treated for an upper respiratory infection 04/13/2021.  He was given an antibiotic in case he failed to improve which he did not need.  He has not had antibiotics recently.  He has not had allergies or sinus problems in the past.  This morning he woke up and he still has a facial pain but also had purulent yellow discharge from his left nostril that had blood streaks in it.  He is here for evaluation.  Past Medical History:  Diagnosis Date   Kidney stones     Patient Active Problem List   Diagnosis Date Noted   Plantar fasciitis 11/13/2016   Internal derangement of right knee 08/20/2015   Primary osteoarthritis of right knee 08/20/2015   Pain in unspecified knee 10/23/2012   ACL tear 05/17/2012   Medial meniscus tear 05/17/2012    History reviewed. No pertinent surgical history.     Home Medications    Prior to Admission medications   Medication Sig Start Date End Date Taking? Authorizing Provider  amoxicillin-clavulanate (AUGMENTIN) 875-125 MG tablet Take 1 tablet by mouth every 12 (twelve) hours. 05/02/21  Yes Eustace Moore, MD    Family History Family History  Problem Relation Age of Onset   Healthy Mother    Healthy Father     Social History Social History   Tobacco Use   Smoking status: Never   Smokeless tobacco: Never  Vaping Use   Vaping Use: Never used  Substance Use Topics    Alcohol use: No   Drug use: Never     Allergies   Patient has no known allergies.   Review of Systems Review of Systems See HPI  Physical Exam Triage Vital Signs ED Triage Vitals  Enc Vitals Group     BP 05/02/21 0813 134/87     Pulse Rate 05/02/21 0813 87     Resp 05/02/21 0813 14     Temp 05/02/21 0813 98.5 F (36.9 C)     Temp Source 05/02/21 0813 Oral     SpO2 05/02/21 0813 96 %     Weight 05/02/21 0814 205 lb (93 kg)     Height --      Head Circumference --      Peak Flow --      Pain Score 05/02/21 0814 7     Pain Loc --      Pain Edu? --      Excl. in GC? --    No data found.  Updated Vital Signs BP 134/87 (BP Location: Left Arm)    Pulse 87    Temp 98.5 F (36.9 C) (Oral)    Resp 14    Wt 93 kg    SpO2 96%    BMI 27.05 kg/m  Physical Exam Constitutional:      General: He is not in acute distress.    Appearance: He is well-developed and normal weight.  HENT:     Head: Normocephalic and atraumatic.      Right Ear: Tympanic membrane and ear canal normal.     Left Ear: Tympanic membrane and ear canal normal.     Nose: Nose normal. No rhinorrhea.     Comments: Deviated septum    Mouth/Throat:     Pharynx: Oropharynx is clear. No posterior oropharyngeal erythema.  Eyes:     Conjunctiva/sclera: Conjunctivae normal.     Pupils: Pupils are equal, round, and reactive to light.  Cardiovascular:     Rate and Rhythm: Normal rate.  Pulmonary:     Effort: Pulmonary effort is normal. No respiratory distress.  Abdominal:     General: There is no distension.     Palpations: Abdomen is soft.  Musculoskeletal:        General: Normal range of motion.     Cervical back: Normal range of motion.  Skin:    General: Skin is warm and dry.  Neurological:     Mental Status: He is alert.     UC Treatments / Results  Labs (all labs ordered are listed, but only abnormal results are displayed) Labs Reviewed - No data to display  EKG   Radiology No  results found.  Procedures Procedures (including critical care time)  Medications Ordered in UC Medications - No data to display  Initial Impression / Assessment and Plan / UC Course  I have reviewed the triage vital signs and the nursing notes.  Pertinent labs & imaging results that were available during my care of the patient were reviewed by me and considered in my medical decision making (see chart for details).  Even though of sinus infections are viral, I believe the unilateral sinus infection with purulent discharge is likely bacterial.  Going to cover him with Augmentin.  Discussed increasing fluids.  Providing drainage with Mucinex and Flonase.  Return as needed Final Clinical Impressions(s) / UC Diagnoses   Final diagnoses:  Acute non-recurrent maxillary sinusitis     Discharge Instructions      Make sure that you drink lots of water Take the antibiotic 2 x a day for 7 days Use muicinex if the drainage is thick Use flonase if the nasal passages are swollen Return as needed    ED Prescriptions     Medication Sig Dispense Auth. Provider   amoxicillin-clavulanate (AUGMENTIN) 875-125 MG tablet Take 1 tablet by mouth every 12 (twelve) hours. 14 tablet Eustace Moore, MD      PDMP not reviewed this encounter.   Eustace Moore, MD 05/02/21 [phone]

## 2021-05-02 NOTE — Discharge Instructions (Signed)
Make sure that you drink lots of water Take the antibiotic 2 x a day for 7 days Use muicinex if the drainage is thick Use flonase if the nasal passages are swollen Return as needed

## 2021-05-02 NOTE — ED Triage Notes (Signed)
Pt presents with left side facial pain that began yesterday morning. Pt states he began having thick mucous this morning.

## 2021-07-15 ENCOUNTER — Ambulatory Visit
Admission: EM | Admit: 2021-07-15 | Discharge: 2021-07-15 | Disposition: A | Discharge status: Home / Self Care | Payer: No Typology Code available for payment source | Attending: Physician Assistant | Admitting: Physician Assistant

## 2021-07-15 ENCOUNTER — Ambulatory Visit (INDEPENDENT_AMBULATORY_CARE_PROVIDER_SITE_OTHER): Payer: No Typology Code available for payment source

## 2021-07-15 ENCOUNTER — Other Ambulatory Visit: Payer: Self-pay

## 2021-07-15 DIAGNOSIS — J209 Acute bronchitis, unspecified: Secondary | ICD-10-CM

## 2021-07-15 DIAGNOSIS — H6982 Other specified disorders of Eustachian tube, left ear: Secondary | ICD-10-CM

## 2021-07-15 DIAGNOSIS — R059 Cough, unspecified: Secondary | ICD-10-CM

## 2021-07-15 MED ORDER — PREDNISONE 20 MG PO TABS: 40.0000 mg | ORAL_TABLET | Freq: Every day | ORAL | 0 refills | Status: AC

## 2021-07-15 NOTE — Discharge Instructions (Signed)
?  Please follow up with Primary Care as scheduled.  ? ? ?

## 2021-07-15 NOTE — ED Triage Notes (Signed)
59mo h/o cough and left ear pressure. Notes some runny nose and post nasal drip.  Has tried one dose of cough syrup w/o relief. No v/d. ?

## 2021-07-15 NOTE — ED Provider Notes (Signed)
?[facility] ? ? ? ?CSN: [id number] ?Arrival date & time: 07/15/21  1101 ? ? ?  ? ?History   ?Chief Complaint ?Chief Complaint  ?Patient presents with  ? Cough  ? ? ?HPI ?James Delacruz is a 41 y.o. male.  ? ?Patient here today for evaluation of cough he has had the last 2 months. He also reports development of left ear pressure. He has had some runny nose and PND. He has tried one dose of cough syrup without significant relief. He has not had any vomiting or diarrhea.  ? ?He also notes some pain that will wrap around his right flank, abdomen, groin at times. This seems to occur with lifting his leg in sitting position rather than with activity. He reports that he has had a few episodes of his legs going numb while walking as well. He does not report any known injury.  ? ?The history is provided by the patient.  ? ?Past Medical History:  ?Diagnosis Date  ? Kidney stones   ? ? ?Patient Active Problem List  ? Diagnosis Date Noted  ? Plantar fasciitis 11/13/2016  ? Internal derangement of right knee 08/20/2015  ? Primary osteoarthritis of right knee 08/20/2015  ? Pain in unspecified knee 10/23/2012  ? ACL tear 05/17/2012  ? Medial meniscus tear 05/17/2012  ? ? ?History reviewed. No pertinent surgical history. ? ? ? ? ?Home Medications   ? ?Prior to Admission medications   ?Medication Sig Start Date End Date Taking? Authorizing Provider  ?predniSONE (DELTASONE) 20 MG tablet Take 2 tablets (40 mg total) by mouth daily with breakfast for 5 days. 07/15/21 07/20/21 Yes Tomi Bamberger, PA-C  ?amoxicillin-clavulanate (AUGMENTIN) 875-125 MG tablet Take 1 tablet by mouth every 12 (twelve) hours. 05/02/21   Eustace Moore, MD  ? ? ?Family History ?Family History  ?Problem Relation Age of Onset  ? Healthy Mother   ? Healthy Father   ? ? ?Social History ?Social History  ? ?Tobacco Use  ? Smoking status: Never  ? Smokeless tobacco: Never  ?Vaping Use  ? Vaping Use: Never used  ?Substance Use Topics  ? Alcohol use: No  ?  Drug use: Never  ? ? ? ?Allergies   ?Patient has no known allergies. ? ? ?Review of Systems ?Review of Systems  ?Constitutional:  Negative for chills and fever.  ?HENT:  Positive for congestion, postnasal drip and rhinorrhea. Negative for ear pain and sore throat.   ?Eyes:  Negative for discharge and redness.  ?Respiratory:  Positive for cough. Negative for shortness of breath.   ?Gastrointestinal:  Negative for abdominal pain, nausea and vomiting.  ?Musculoskeletal:  Positive for back pain and myalgias.  ?Neurological:  Positive for numbness. Negative for weakness.  ? ? ?Physical Exam ?Triage Vital Signs ?ED Triage Vitals  ?Enc Vitals Group  ?   BP   ?   Pulse   ?   Resp   ?   Temp   ?   Temp src   ?   SpO2   ?   Weight   ?   Height   ?   Head Circumference   ?   Peak Flow   ?   Pain Score   ?   Pain Loc   ?   Pain Edu?   ?   Excl. in GC?   ? ?No data found. ? ?Updated Vital Signs ?BP 127/81 (BP Location: Left Arm)   Pulse 68  Temp 98.6 ?F (37 ?C) (Oral)   Resp 18   SpO2 98%  ? ?   ? ?Physical Exam ?Vitals and nursing note reviewed.  ?Constitutional:   ?   General: He is not in acute distress. ?   Appearance: Normal appearance. He is not ill-appearing.  ?HENT:  ?   Head: Normocephalic and atraumatic.  ?   Left Ear: Tympanic membrane normal.  ?   Nose: Nose normal. No congestion.  ?Eyes:  ?   Conjunctiva/sclera: Conjunctivae normal.  ?Cardiovascular:  ?   Rate and Rhythm: Normal rate and regular rhythm.  ?   Heart sounds: Normal heart sounds. No murmur heard. ?Pulmonary:  ?   Effort: Pulmonary effort is normal. No respiratory distress.  ?   Breath sounds: Normal breath sounds. No wheezing, rhonchi or rales.  ?Musculoskeletal:  ?   Comments: No TTP to midline spine  ?Skin: ?   General: Skin is warm and dry.  ?Neurological:  ?   Mental Status: He is alert.  ?Psychiatric:     ?   Mood and Affect: Mood normal.     ?   Thought Content: Thought content normal.  ? ? ? ?UC Treatments / Results  ?Labs ?(all labs ordered  are listed, but only abnormal results are displayed) ?Labs Reviewed - No data to display ? ?EKG ? ? ?Radiology ?DG Chest 2 View ? ?Result Date: 07/15/2021 ?CLINICAL DATA:  Cough over the last 2 months EXAM: CHEST - 2 VIEW COMPARISON:  05/16/2020 FINDINGS: Chronic mild prominence the right heart border. Hazy density in the right upper chest appears to be relatively symmetric and probably due to soft tissues of the chest wall rather than a pulmonary abnormality. The streaky opacity in this vicinity shown on the prior exam has resolved. The lungs appear otherwise clear. No blunting of the costophrenic angles. No substantial bony abnormality observed. IMPRESSION: 1. No significant intrapulmonary abnormality is currently identified. 2. Chronic mild prominence the right atrial border, which can sometimes be associated with valvular disease, atrial septal defect, or raised right ventricular pressures-consider echocardiography. Electronically Signed   By: Gaylyn Rong M.D.   On: 07/15/2021 11:45   ? ?Procedures ?Procedures (including critical care time) ? ?Medications Ordered in UC ?Medications - No data to display ? ?Initial Impression / Assessment and Plan / UC Course  ?I have reviewed the triage vital signs and the nursing notes. ? ?Pertinent labs & imaging results that were available during my care of the patient were reviewed by me and considered in my medical decision making (see chart for details). ? ?  ?CXR without pulmonary findings- will treat to cover bronchitis. Some questionable finding on Xray- discussed with patient and recommended further evaluation by PCP. Discussed that lower back issues may also be alleviated by steroid burst but recommended follow up with any further concerns.  He does note he has follow up at [place] (his PCP) in about 3 weeks.  ? ?Final Clinical Impressions(s) / UC Diagnoses  ? ?Final diagnoses:  ?Acute bronchitis, unspecified organism  ?Dysfunction of left eustachian tube   ? ? ? ?Discharge Instructions   ? ?  ? ?Please follow up with Primary Care as scheduled.  ? ? ? ? ? ? ?ED Prescriptions   ? ? Medication Sig Dispense Auth. Provider  ? predniSONE (DELTASONE) 20 MG tablet Take 2 tablets (40 mg total) by mouth daily with breakfast for 5 days. 10 tablet Tomi Bamberger, PA-C  ? ?  ? ?  PDMP not reviewed this encounter. ?  ?Tomi BambergerMyers, Artie Takayama F, PA-C ?07/15/21 1351 ? ?

## 2021-08-19 DIAGNOSIS — K219 Gastro-esophageal reflux disease without esophagitis: Secondary | ICD-10-CM | POA: Insufficient documentation

## 2021-08-19 DIAGNOSIS — H903 Sensorineural hearing loss, bilateral: Secondary | ICD-10-CM | POA: Insufficient documentation

## 2021-08-25 DIAGNOSIS — H6123 Impacted cerumen, bilateral: Secondary | ICD-10-CM | POA: Insufficient documentation

## 2022-04-05 ENCOUNTER — Ambulatory Visit
Admission: RE | Admit: 2022-04-05 | Discharge: 2022-04-05 | Disposition: A | Discharge status: Home / Self Care | Payer: Federal, State, Local not specified - PPO | Source: Ambulatory Visit

## 2022-04-05 ENCOUNTER — Ambulatory Visit (INDEPENDENT_AMBULATORY_CARE_PROVIDER_SITE_OTHER): Payer: Federal, State, Local not specified - PPO

## 2022-04-05 ENCOUNTER — Other Ambulatory Visit: Payer: Self-pay

## 2022-04-05 VITALS — BP 115/75 | HR 60 | Temp 98.4°F | Resp 20 | Ht 73.0 in | Wt 210.0 lb

## 2022-04-05 DIAGNOSIS — R0989 Other specified symptoms and signs involving the circulatory and respiratory systems: Secondary | ICD-10-CM | POA: Diagnosis not present

## 2022-04-05 DIAGNOSIS — R059 Cough, unspecified: Secondary | ICD-10-CM | POA: Diagnosis not present

## 2022-04-05 DIAGNOSIS — J0100 Acute maxillary sinusitis, unspecified: Secondary | ICD-10-CM

## 2022-04-05 MED ORDER — PROMETHAZINE-DM 6.25-15 MG/5ML PO SYRP: 5.0000 mL | ORAL_SOLUTION | Freq: Two times a day (BID) | ORAL | 0 refills | Status: DC | PRN

## 2022-04-05 MED ORDER — PREDNISONE 20 MG PO TABS: ORAL_TABLET | ORAL | 0 refills | Status: DC

## 2022-04-05 MED ORDER — DOXYCYCLINE HYCLATE 100 MG PO CAPS: 100.0000 mg | ORAL_CAPSULE | Freq: Two times a day (BID) | ORAL | 0 refills | Status: AC

## 2022-04-05 MED ORDER — BENZONATATE 200 MG PO CAPS: 200.0000 mg | ORAL_CAPSULE | Freq: Three times a day (TID) | ORAL | 0 refills | Status: AC | PRN

## 2022-04-05 NOTE — ED Triage Notes (Signed)
Pt presents to Urgent Care with c/o cough, sore throat, and nasal congestion x 9 days. Concerned due to having pneumonia 1-2 years ago.

## 2022-04-05 NOTE — Discharge Instructions (Addendum)
Advised patient to take medication as directed with food to completion.  Advised patient to take prednisone with first dose of Doxycycline for the next 5 of 7 days.  Advised may take Tessalon daily or as needed for cough.  Advised may take Promethazine DM at night prior to sleep for cough due to sedate of effects.  Advised do not use cough medications together.  Encouraged patient to increase daily water intake to 64 ounces per day while taking these medications.

## 2022-04-05 NOTE — ED Provider Notes (Signed)
[facility]    CSN: [id number] Arrival date & time: 04/05/22  1138      History   Chief Complaint Chief Complaint  Patient presents with   Cough   Sore Throat   Nasal Congestion   1200 appt    HPI James Delacruz is a 41 y.o. male.   HPI 41 year old male presents with cough, sore throat, nasal congestion for 9 days.  Concern of having pneumonia 1 to 2 years ago.  PMH significant for obesity and kidney stones.  Past Medical History:  Diagnosis Date   Kidney stones     Patient Active Problem List   Diagnosis Date Noted   Plantar fasciitis 11/13/2016   Internal derangement of right knee 08/20/2015   Primary osteoarthritis of right knee 08/20/2015   Pain in unspecified knee 10/23/2012   ACL tear 05/17/2012   Medial meniscus tear 05/17/2012    History reviewed. No pertinent surgical history.     Home Medications    Prior to Admission medications   Medication Sig Start Date End Date Taking? Authorizing Provider  benzonatate (TESSALON) 200 MG capsule Take 1 capsule (200 mg total) by mouth 3 (three) times daily as needed for up to 7 days. 04/05/22 04/12/22 Yes Trevor Iha, FNP  doxycycline (VIBRAMYCIN) 100 MG capsule Take 1 capsule (100 mg total) by mouth 2 (two) times daily for 7 days. 04/05/22 04/12/22 Yes Trevor Iha, FNP  predniSONE (DELTASONE) 20 MG tablet Take 3 tabs PO daily x 5 days. 04/05/22  Yes Trevor Iha, FNP  promethazine-dextromethorphan (PROMETHAZINE-DM) 6.25-15 MG/5ML syrup Take 5 mLs by mouth 2 (two) times daily as needed for cough. 04/05/22  Yes Trevor Iha, FNP    Family History Family History  Problem Relation Age of Onset   Healthy Mother    Healthy Father     Social History Social History   Tobacco Use   Smoking status: Never   Smokeless tobacco: Never  Vaping Use   Vaping Use: Never used  Substance Use Topics   Alcohol use: No   Drug use: Never     Allergies   Patient has no known allergies.   Review  of Systems Review of Systems  HENT:  Positive for congestion and sore throat.   Respiratory:  Positive for cough.   All other systems reviewed and are negative.    Physical Exam Triage Vital Signs ED Triage Vitals  Enc Vitals Group     BP 04/05/22 1238 115/75     Pulse Rate 04/05/22 1238 60     Resp 04/05/22 1238 20     Temp 04/05/22 1238 98.4 F (36.9 C)     Temp Source 04/05/22 1238 Oral     SpO2 04/05/22 1238 95 %     Weight 04/05/22 1235 210 lb (95.3 kg)     Height 04/05/22 1235 6\' 1"  (1.854 m)     Head Circumference --      Peak Flow --      Pain Score 04/05/22 1235 3     Pain Loc --      Pain Edu? --      Excl. in GC? --    No data found.  Updated Vital Signs BP 115/75   Pulse 60   Temp 98.4 F (36.9 C) (Oral)   Resp 20   Ht 6\' 1"  (1.854 m)   Wt 210 lb (95.3 kg)   SpO2 95%   BMI 27.71 kg/m   Physical Exam Vitals and  nursing note reviewed.  Constitutional:      Appearance: Normal appearance. He is obese. He is ill-appearing.  HENT:     Head: Normocephalic and atraumatic.     Right Ear: Tympanic membrane and external ear normal.     Left Ear: Tympanic membrane and external ear normal.     Ears:     Comments: Significant eustachian tube dysfunction noted bilaterally    Mouth/Throat:     Mouth: Mucous membranes are moist.     Pharynx: Oropharynx is clear.  Eyes:     Extraocular Movements: Extraocular movements intact.     Conjunctiva/sclera: Conjunctivae normal.     Pupils: Pupils are equal, round, and reactive to light.  Cardiovascular:     Rate and Rhythm: Normal rate and regular rhythm.     Pulses: Normal pulses.     Heart sounds: Normal heart sounds. No murmur heard. Pulmonary:     Effort: Pulmonary effort is normal.     Breath sounds: Normal breath sounds. No wheezing, rhonchi or rales.     Comments: Frequent nonproductive cough noted on exam Musculoskeletal:        General: Normal range of motion.     Cervical back: Normal range of motion  and neck supple. No tenderness.  Lymphadenopathy:     Cervical: No cervical adenopathy.  Skin:    General: Skin is warm and dry.  Neurological:     General: No focal deficit present.     Mental Status: He is alert and oriented to person, place, and time. Mental status is at baseline.      UC Treatments / Results  Labs (all labs ordered are listed, but only abnormal results are displayed) Labs Reviewed - No data to display  EKG   Radiology DG Chest 2 View  Result Date: 04/05/2022 CLINICAL DATA:  Cough and chest congestion for the past 9 days. EXAM: CHEST - 2 VIEW COMPARISON:  None Available. FINDINGS: The heart size and mediastinal contours are within normal limits. Both lungs are clear. The visualized skeletal structures are unremarkable. IMPRESSION: No active cardiopulmonary disease. Electronically Signed   By: Beckie Salts M.D.   On: 04/05/2022 14:08    Procedures Procedures (including critical care time)  Medications Ordered in UC Medications - No data to display  Initial Impression / Assessment and Plan / UC Course  I have reviewed the triage vital signs and the nursing notes.  Pertinent labs & imaging results that were available during my care of the patient were reviewed by me and considered in my medical decision making (see chart for details).     MDM: 1.  Acute maxillary sinusitis, recurrence not specified-Rx'd Doxycycline; 2.  Cough-CXR revealed above, Rx'd prednisone, Tessalon, Promethazine DM. Advised patient to take medication as directed with food to completion.  Advised patient to take prednisone with first dose of Doxycycline for the next 5 of 7 days.  Advised may take Tessalon daily or as needed for cough.  Advised may take Promethazine DM at night prior to sleep for cough due to sedate of effects.  Advised do not use cough medications together.  Encouraged patient to increase daily water intake to 64 ounces per day while taking these medications. Final  Clinical Impressions(s) / UC Diagnoses   Final diagnoses:  Acute maxillary sinusitis, recurrence not specified  Cough, unspecified type     Discharge Instructions      Advised patient to take medication as directed with food to completion.  Advised patient  to take prednisone with first dose of Doxycycline for the next 5 of 7 days.  Advised may take Tessalon daily or as needed for cough.  Advised may take Promethazine DM at night prior to sleep for cough due to sedate of effects.  Advised do not use cough medications together.  Encouraged patient to increase daily water intake to 64 ounces per day while taking these medications.     ED Prescriptions     Medication Sig Dispense Auth. Provider   doxycycline (VIBRAMYCIN) 100 MG capsule Take 1 capsule (100 mg total) by mouth 2 (two) times daily for 7 days. 20 capsule Trevor Iha, FNP   predniSONE (DELTASONE) 20 MG tablet Take 3 tabs PO daily x 5 days. 15 tablet Trevor Iha, FNP   benzonatate (TESSALON) 200 MG capsule Take 1 capsule (200 mg total) by mouth 3 (three) times daily as needed for up to 7 days. 40 capsule Trevor Iha, FNP   promethazine-dextromethorphan (PROMETHAZINE-DM) 6.25-15 MG/5ML syrup Take 5 mLs by mouth 2 (two) times daily as needed for cough. 118 mL Trevor Iha, FNP      PDMP not reviewed this encounter.   Trevor Iha, FNP 04/05/22 1422

## 2022-07-27 ENCOUNTER — Emergency Department (HOSPITAL_COMMUNITY): Payer: No Typology Code available for payment source

## 2022-07-27 ENCOUNTER — Encounter (HOSPITAL_COMMUNITY): Payer: Self-pay

## 2022-07-27 ENCOUNTER — Other Ambulatory Visit: Payer: Self-pay

## 2022-07-27 ENCOUNTER — Emergency Department (HOSPITAL_COMMUNITY)
Admission: EM | Admit: 2022-07-27 | Discharge: 2022-07-27 | Disposition: A | Discharge status: Home / Self Care | Payer: No Typology Code available for payment source | Attending: Emergency Medicine | Admitting: Emergency Medicine

## 2022-07-27 DIAGNOSIS — E876 Hypokalemia: Secondary | ICD-10-CM | POA: Diagnosis not present

## 2022-07-27 DIAGNOSIS — N200 Calculus of kidney: Secondary | ICD-10-CM | POA: Diagnosis not present

## 2022-07-27 DIAGNOSIS — R1031 Right lower quadrant pain: Secondary | ICD-10-CM | POA: Diagnosis present

## 2022-07-27 LAB — URINALYSIS, ROUTINE W REFLEX MICROSCOPIC
Bacteria, UA: NONE SEEN
Bilirubin Urine: NEGATIVE
Glucose, UA: NEGATIVE mg/dL
Ketones, ur: NEGATIVE mg/dL
Leukocytes,Ua: NEGATIVE
Nitrite: NEGATIVE
Protein, ur: 30 mg/dL — AB
RBC / HPF: >50 RBC/hpf (ref 0–5)
Specific Gravity, Urine: >1.046 — ABNORMAL HIGH (ref 1.005–1.030)
pH: 6 (ref 5.0–8.0)

## 2022-07-27 LAB — CBC
HCT: 46.1 % (ref 39.0–52.0)
Hemoglobin: 15.7 g/dL (ref 13.0–17.0)
MCH: 28.9 pg (ref 26.0–34.0)
MCHC: 34.1 g/dL (ref 30.0–36.0)
MCV: 84.9 fL (ref 80.0–100.0)
Platelets: 213 10*3/uL (ref 150–400)
RBC: 5.43 MIL/uL (ref 4.22–5.81)
RDW: 12 % (ref 11.5–15.5)
WBC: 8.2 10*3/uL (ref 4.0–10.5)
nRBC: 0 % (ref 0.0–0.2)

## 2022-07-27 LAB — COMPREHENSIVE METABOLIC PANEL
ALT: 17 U/L (ref 0–44)
AST: 16 U/L (ref 15–41)
Albumin: 3.1 g/dL — ABNORMAL LOW (ref 3.5–5.0)
Alkaline Phosphatase: 55 U/L (ref 38–126)
Anion gap: 8 (ref 5–15)
BUN: 11 mg/dL (ref 6–20)
CO2: 21 mmol/L — ABNORMAL LOW (ref 22–32)
Calcium: 7.3 mg/dL — ABNORMAL LOW (ref 8.9–10.3)
Chloride: 111 mmol/L (ref 98–111)
Creatinine, Ser: 1.08 mg/dL (ref 0.61–1.24)
GFR, Estimated: >60 mL/min (ref >60)
Glucose, Bld: 137 mg/dL — ABNORMAL HIGH (ref 70–99)
Potassium: 3 mmol/L — ABNORMAL LOW (ref 3.5–5.1)
Sodium: 140 mmol/L (ref 135–145)
Total Bilirubin: 0.8 mg/dL (ref 0.3–1.2)
Total Protein: 5.7 g/dL — ABNORMAL LOW (ref 6.5–8.1)

## 2022-07-27 LAB — LIPASE, BLOOD: Lipase: 34 U/L (ref 11–51)

## 2022-07-27 MED ORDER — ONDANSETRON HCL 4 MG/2ML IJ SOLN: 4.0000 mg | Freq: Once | INTRAMUSCULAR | Status: DC

## 2022-07-27 MED ORDER — IOHEXOL 350 MG/ML SOLN
75.0000 mL | Freq: Once | INTRAVENOUS | Status: AC | PRN
  Administered 2022-07-27: 75 mL via INTRAVENOUS

## 2022-07-27 MED ORDER — SODIUM CHLORIDE 0.9 % IV BOLUS: 1000.0000 mL | Freq: Once | INTRAVENOUS | Status: DC

## 2022-07-27 MED ORDER — KETOROLAC TROMETHAMINE 30 MG/ML IJ SOLN: 30.0000 mg | Freq: Once | INTRAMUSCULAR | Status: DC

## 2022-07-27 MED ORDER — POTASSIUM CHLORIDE CRYS ER 20 MEQ PO TBCR
40.0000 meq | EXTENDED_RELEASE_TABLET | Freq: Once | ORAL | Status: AC
  Administered 2022-07-27: 40 meq via ORAL
  Filled 2022-07-27: qty 2

## 2022-07-27 MED ORDER — TAMSULOSIN HCL 0.4 MG PO CAPS: 0.4000 mg | ORAL_CAPSULE | Freq: Two times a day (BID) | ORAL | 0 refills | Status: DC

## 2022-07-27 NOTE — Discharge Instructions (Signed)
You have a 4 mm right-sided kidney stone that is the probable cause of your symptoms.  Your urine does not appear infected.  Your potassium level was a bit low at 3.0.  This could be due in part to diarrhea from recent antibiotic use.  Please increase your fluid intake.  Please follow-up with the urology office if you do not pass the stone in the next 48 hours.  Return for new or worsening symptoms.

## 2022-07-27 NOTE — ED Provider Notes (Signed)
[facility] Emergency Department Provider Note MRN:  [id number]  Arrival date & time: 07/27/22     Chief Complaint   Abdominal Pain   History of Present Illness   James Delacruz is a 42 y.o. year-old male presents to the ED with chief complaint of right lower quad abdominal pain for the past 2 days.  He rates his pain as a 9 out of 10.  He reports associated nausea.  Denies fever, chills, vomiting.  Denies urinary symptoms.  States that he has been taking amoxicillin for sinus infection.  He reports associated diarrhea from amoxicillin.  History provided by patient.   Review of Systems  Pertinent positive and negative review of systems noted in HPI.    Physical Exam   Vitals:   07/27/22 0100 07/27/22 0130  BP: 115/76 119/81  Pulse: 75 74  Resp: 14 16  Temp:    SpO2: 99% 96%    CONSTITUTIONAL:  Non toxic-appearing, NAD NEURO:  Alert and oriented x 3, CN 3-12 grossly intact EYES:  eyes equal and reactive ENT/NECK:  Supple, no stridor  CARDIO:  normal rate, regular rhythm, appears well-perfused  PULM:  No respiratory distress, CTAB GI/GU:  non-distended, RLQ TTP MSK/SPINE:  No gross deformities, no edema, moves all extremities  SKIN:  no rash, atraumatic   *Additional and/or pertinent findings included in MDM below  Diagnostic and Interventional Summary    EKG Interpretation  Date/Time:    Ventricular Rate:    PR Interval:    QRS Duration:   QT Interval:    QTC Calculation:   R Axis:     Text Interpretation:         Labs Reviewed  COMPREHENSIVE METABOLIC PANEL - Abnormal; Notable for the following components:      Result Value   Potassium 3.0 (*)    CO2 21 (*)    Glucose, Bld 137 (*)    Calcium 7.3 (*)    Total Protein 5.7 (*)    Albumin 3.1 (*)    All other components within normal limits  URINALYSIS, ROUTINE W REFLEX MICROSCOPIC - Abnormal; Notable for the following components:   APPearance HAZY (*)    Specific Gravity, Urine  >1.046 (*)    Hgb urine dipstick LARGE (*)    Protein, ur 30 (*)    All other components within normal limits  LIPASE, BLOOD  CBC    CT ABDOMEN PELVIS W CONTRAST  Final Result      Medications  potassium chloride SA (KLOR-CON M) CR tablet 40 mEq (has no administration in time range)  iohexol (OMNIPAQUE) 350 MG/ML injection 75 mL (75 mLs Intravenous Contrast Given 07/27/22 0254)     Procedures  /  Critical Care Procedures  ED Course and Medical Decision Making  I have reviewed the triage vital signs, the nursing notes, and pertinent available records from the EMR.  Social Determinants Affecting Complexity of Care: Patient has no clinically significant social determinants affecting this chief complaint..   ED Course: Clinical Course as of 07/27/22 0404  Sun Jul 27, 2022  0041 Patient offered pain medication, but declined. [RB]    Clinical Course User Index [RB] Roxy Horseman, PA-C    Medical Decision Making Patient here with right-sided lower abdominal pain that started 2 days ago.  Has been gradually worsening.  Reports nausea, but denies any vomiting.  He states that he has had some diarrhea, but associates this with recent antibiotic use for sinus infection.  He is fairly tender on exam.  Will check CT and labs.  Patient offered pain medicine, but he declined.  Declines outpatient pain medicine.  Amount and/or Complexity of Data Reviewed Labs: ordered.    Details: Potassium was 3.0, replaced in ED No leukocytosis Urinalysis noted for RBCs Radiology: ordered and independent interpretation performed.    Details: Right-sided ureteral stone present  Risk Prescription drug management.     Consultants: No consultations were needed in caring for this patient.   Treatment and Plan: Emergency department workup does not suggest an emergent condition requiring admission or immediate intervention beyond  what has been performed at this time. The patient is safe for  discharge and has  been instructed to return immediately for worsening symptoms, change in  symptoms or any other concerns    Final Clinical Impressions(s) / ED Diagnoses     ICD-10-CM   1. Kidney stone  N20.0     2. Hypokalemia  E87.6       ED Discharge Orders          Ordered    tamsulosin (FLOMAX) 0.4 MG CAPS capsule  2 times daily        07/27/22 0401              Discharge Instructions Discussed with and Provided to Patient:     Discharge Instructions      You have a 4 mm right-sided kidney stone that is the probable cause of your symptoms.  Your urine does not appear infected.  Your potassium level was a bit low at 3.0.  This could be due in part to diarrhea from recent antibiotic use.  Please increase your fluid intake.  Please follow-up with the urology office if you do not pass the stone in the next 48 hours.  Return for new or worsening symptoms.       Roxy Horseman, PA-C 07/27/22 0405    Sabas Sous, MD 07/28/22 [id number]

## 2022-07-27 NOTE — ED Triage Notes (Signed)
Pt to ED by POV from home with intermittent RLQ abdominal pain which began yesterday. Pt endorses nausea and diarrhea, no vomiting. Arrives A+O, VSS.

## 2022-08-15 ENCOUNTER — Emergency Department (HOSPITAL_COMMUNITY)
Admission: EM | Admit: 2022-08-15 | Discharge: 2022-08-16 | Disposition: A | Discharge status: Home / Self Care | Payer: No Typology Code available for payment source | Attending: Emergency Medicine | Admitting: Emergency Medicine

## 2022-08-15 ENCOUNTER — Other Ambulatory Visit: Payer: Self-pay

## 2022-08-15 ENCOUNTER — Emergency Department (HOSPITAL_COMMUNITY): Payer: No Typology Code available for payment source

## 2022-08-15 DIAGNOSIS — R109 Unspecified abdominal pain: Secondary | ICD-10-CM | POA: Diagnosis present

## 2022-08-15 DIAGNOSIS — N200 Calculus of kidney: Secondary | ICD-10-CM | POA: Diagnosis not present

## 2022-08-15 LAB — CBC
HCT: 45.3 % (ref 39.0–52.0)
Hemoglobin: 16 g/dL (ref 13.0–17.0)
MCH: 29.3 pg (ref 26.0–34.0)
MCHC: 35.3 g/dL (ref 30.0–36.0)
MCV: 83 fL (ref 80.0–100.0)
Platelets: 203 10*3/uL (ref 150–400)
RBC: 5.46 MIL/uL (ref 4.22–5.81)
RDW: 11.9 % (ref 11.5–15.5)
WBC: 10 10*3/uL (ref 4.0–10.5)
nRBC: 0 % (ref 0.0–0.2)

## 2022-08-15 LAB — URINALYSIS, ROUTINE W REFLEX MICROSCOPIC
Bacteria, UA: NONE SEEN
RBC / HPF: >50 RBC/hpf (ref 0–5)

## 2022-08-15 LAB — COMPREHENSIVE METABOLIC PANEL
ALT: 21 U/L (ref 0–44)
AST: 22 U/L (ref 15–41)
Albumin: 4.1 g/dL (ref 3.5–5.0)
Alkaline Phosphatase: 70 U/L (ref 38–126)
Anion gap: 11 (ref 5–15)
BUN: 11 mg/dL (ref 6–20)
CO2: 24 mmol/L (ref 22–32)
Calcium: 9.2 mg/dL (ref 8.9–10.3)
Chloride: 101 mmol/L (ref 98–111)
Creatinine, Ser: 1.17 mg/dL (ref 0.61–1.24)
GFR, Estimated: >60 mL/min (ref >60)
Glucose, Bld: 143 mg/dL — ABNORMAL HIGH (ref 70–99)
Potassium: 3.9 mmol/L (ref 3.5–5.1)
Sodium: 136 mmol/L (ref 135–145)
Total Bilirubin: 1 mg/dL (ref 0.3–1.2)
Total Protein: 7.1 g/dL (ref 6.5–8.1)

## 2022-08-15 LAB — LIPASE, BLOOD: Lipase: 42 U/L (ref 11–51)

## 2022-08-15 MED ORDER — IOHEXOL 350 MG/ML SOLN
75.0000 mL | Freq: Once | INTRAVENOUS | Status: AC | PRN
  Administered 2022-08-15: 75 mL via INTRAVENOUS

## 2022-08-15 MED ORDER — ONDANSETRON HCL 4 MG/2ML IJ SOLN
4.0000 mg | Freq: Once | INTRAMUSCULAR | Status: AC
  Administered 2022-08-15: 4 mg via INTRAVENOUS
  Filled 2022-08-15: qty 2

## 2022-08-15 MED ORDER — HYDROMORPHONE HCL 1 MG/ML IJ SOLN
1.0000 mg | Freq: Once | INTRAMUSCULAR | Status: AC
  Administered 2022-08-15: 1 mg via INTRAVENOUS
  Filled 2022-08-15: qty 1

## 2022-08-15 NOTE — ED Provider Notes (Signed)
[place] EMERGENCY DEPARTMENT AT [facility] Provider Note   CSN: [id number] Arrival date & time: 08/15/22  2102     History {Add pertinent medical, surgical, social history, OB history to HPI:1} Chief Complaint  Patient presents with   Abdominal Pain    James Delacruz is a 42 y.o. male.  HPI   Patient with history of kidney stones presented with complaints of right-sided flank pain.  Started earlier today, came on suddenly, states he feels some amount of pain above his bladder, states he had difficulty urinating, the pain has subsided in his bladder is now going back into his right flank, states he has associated nausea vomiting, still passing gas having normal bowel movements, states he has hematuria and dysuria, no associated fevers chills cough congestion.  Patient states that he had kidney stones in the past but this feels worse than usual.  States  he was just diagnosed with a  kidney stone 2 weeks ago but not seen the urologist, he has been taking Flomax.  Reviewed patient's chart was seen on 04/07, diagnosed with a right-sided 4 mm kidney stone mid ureter with mild hydronephrosis.  Home Medications Prior to Admission medications   Medication Sig Start Date End Date Taking? Authorizing Provider  amoxicillin (AMOXIL) 500 MG tablet Take 500 mg by mouth 2 (two) times daily.    [provider]  cholecalciferol 25 MCG (1000 UT) tablet Take 1,000 Units by mouth daily.    [provider]  Multiple Vitamins-Minerals (ZINC PO) Take 1 tablet by mouth daily.    [provider]  predniSONE (DELTASONE) 20 MG tablet Take 3 tabs PO daily x 5 days. Patient not taking: Reported on 07/27/2022 04/05/22   Trevor Iha, FNP  promethazine-dextromethorphan (PROMETHAZINE-DM) 6.25-15 MG/5ML syrup Take 5 mLs by mouth 2 (two) times daily as needed for cough. Patient not taking: Reported on 07/27/2022 04/05/22   Trevor Iha, FNP  tamsulosin (FLOMAX) 0.4 MG CAPS  capsule Take 1 capsule (0.4 mg total) by mouth 2 (two) times daily. 07/27/22   Roxy Horseman, PA-C      Allergies    Patient has no known allergies.    Review of Systems   Review of Systems  Constitutional:  Negative for chills and fever.  Respiratory:  Negative for shortness of breath.   Cardiovascular:  Negative for chest pain.  Gastrointestinal:  Positive for abdominal pain and vomiting.  Genitourinary:  Positive for difficulty urinating, dysuria and flank pain.  Neurological:  Negative for headaches.    Physical Exam Updated Vital Signs BP (!) 140/96 (BP Location: Right Arm)   Pulse 71   Temp 98.6 F (37 C) (Oral)   Resp 16   Ht 6\' 1"  (1.854 m)   Wt 95.3 kg   SpO2 99%   BMI 27.72 kg/m  Physical Exam Vitals and nursing note reviewed.  Constitutional:      General: He is not in acute distress.    Appearance: He is not ill-appearing.  HENT:     Head: Normocephalic and atraumatic.     Nose: No congestion.  Eyes:     Conjunctiva/sclera: Conjunctivae normal.  Cardiovascular:     Rate and Rhythm: Normal rate and regular rhythm.     Pulses: Normal pulses.     Heart sounds: No murmur heard.    No friction rub. No gallop.  Pulmonary:     Effort: No respiratory distress.     Breath sounds: No wheezing, rhonchi or rales.  Abdominal:     Palpations: Abdomen is soft.     Tenderness: There is abdominal tenderness. There is right CVA tenderness. There is no left CVA tenderness.     Comments: Abdomen nondistended, soft, noted tenderness in the right flank and right CVA tenderness, no guarding rebound tenderness or peritoneal sign, there is no notable suprapubic distention or fullness on my examination.  Skin:    General: Skin is warm and dry.  Neurological:     Mental Status: He is alert.  Psychiatric:        Mood and Affect: Mood normal.     ED Results / Procedures / Treatments   Labs (all labs ordered are listed, but only abnormal results are displayed) Labs  Reviewed  CBC  LIPASE, BLOOD  COMPREHENSIVE METABOLIC PANEL  URINALYSIS, ROUTINE W REFLEX MICROSCOPIC    EKG None  Radiology No results found.  Procedures Procedures  {Document cardiac monitor, telemetry assessment procedure when appropriate:1}  Medications Ordered in ED Medications  ondansetron (ZOFRAN) injection 4 mg (has no administration in time range)  HYDROmorphone (DILAUDID) injection 1 mg (has no administration in time range)    ED Course/ Medical Decision Making/ A&P   {   Click here for ABCD2, HEART and other calculatorsREFRESH Note before signing :1}                          Medical Decision Making Risk Prescription drug management.   This patient presents to the ED for concern of right-sided flank pain, this involves an extensive number of treatment options, and is a complaint that carries with it a high risk of complications and morbidity.  The differential diagnosis includes kidney stone, pyelo-, UTI, appendicitis, AAA, dissection    Additional history obtained:  Additional history obtained from partner at bedside External records from outside source obtained and reviewed including recent ER note   Co morbidities that complicate the patient evaluation  Kidney stones  Social Determinants of Health:  N/a    Lab Tests:  I Ordered, and personally interpreted labs.  The pertinent results include: CBC unremarkable   Imaging Studies ordered:  I ordered imaging studies including CT abdomen pelvis I independently visualized and interpreted imaging which showed *** I agree with the radiologist interpretation   Cardiac Monitoring:  The patient was maintained on a cardiac monitor.  I personally viewed and interpreted the cardiac monitored which showed an underlying rhythm of: ***   Medicines ordered and prescription drug management:  I ordered medication including pain pain medication, fluids I have reviewed the patients home medicines and  have made adjustments as needed  Critical Interventions:  ***   Reevaluation:  Presents with abdominal pain, will obtain lab workup and imaging for further evaluation.    Consultations Obtained:  I requested consultation with the ***,  and discussed lab and imaging findings as well as pertinent plan - they recommend: ***    Test Considered:  ***    Rule out ****    Dispostion and problem list  After consideration of the diagnostic results and the patients response to treatment, I feel that the patent would benefit from ***.       {Document critical care time when appropriate:1} {Document review of labs and clinical decision tools ie heart score, Chads2Vasc2 etc:1}  {Document your independent review of radiology images, and any outside records:1} {Document your discussion with family members, caretakers, and with consultants:1} {Document social determinants of health affecting  pt's care:1} {Document your decision making why or why not admission, treatments were needed:1} Final Clinical Impression(s) / ED Diagnoses Final diagnoses:  None    Rx / DC Orders ED Discharge Orders     None

## 2022-08-15 NOTE — ED Triage Notes (Signed)
The pt has  a diagnosis of a  kidney stone and he has not been able to pass it  he has had this for a week  he has been unable to void since 1899

## 2022-08-16 MED ORDER — OXYCODONE-ACETAMINOPHEN 5-325 MG PO TABS: 1.0000 | ORAL_TABLET | Freq: Three times a day (TID) | ORAL | 0 refills | Status: AC | PRN

## 2022-08-16 MED ORDER — ONDANSETRON HCL 4 MG PO TABS: 4.0000 mg | ORAL_TABLET | Freq: Four times a day (QID) | ORAL | 0 refills | Status: DC

## 2022-08-16 MED ORDER — TAMSULOSIN HCL 0.4 MG PO CAPS: 0.4000 mg | ORAL_CAPSULE | Freq: Every day | ORAL | 0 refills | Status: AC

## 2022-08-16 NOTE — Discharge Instructions (Signed)
You have a 6 mm right-sided kidney stone, it is in the middle of your ureter, start you on Flomax this medication will help dilate your ureter to help pass the stone, this medication can lower your blood pressure if you start feel lightheaded or dizziness while take this medication please discontinue.  I recommend over-the-counter pain medication as needed for pain.  I have given you Zofran this will help with nausea.  I have given you a short course of narcotics please take as prescribed.  This medication can make you drowsy do not consume alcohol or operate heavy machinery when taking this medication.  This medication is Tylenol in it do not take Tylenol and take this medication.   Please follow-up with urology  If you have worsening symptoms, you develop fevers chills, uncontrolled nausea vomiting or pain despite the medication I have given you or you are unable to urinate please come back in for reassessment.

## 2023-03-12 ENCOUNTER — Ambulatory Visit: Payer: Federal, State, Local not specified - PPO

## 2023-03-12 ENCOUNTER — Other Ambulatory Visit: Payer: Self-pay

## 2023-03-12 ENCOUNTER — Telehealth: Payer: Self-pay | Admitting: Family Medicine

## 2023-03-12 ENCOUNTER — Ambulatory Visit
Admission: EM | Admit: 2023-03-12 | Discharge: 2023-03-12 | Disposition: A | Discharge status: Home / Self Care | Payer: Federal, State, Local not specified - PPO | Attending: Family Medicine | Admitting: Family Medicine

## 2023-03-12 ENCOUNTER — Encounter: Payer: Self-pay | Admitting: Emergency Medicine

## 2023-03-12 DIAGNOSIS — J189 Pneumonia, unspecified organism: Secondary | ICD-10-CM

## 2023-03-12 MED ORDER — DOXYCYCLINE HYCLATE 100 MG PO CAPS: 100.0000 mg | ORAL_CAPSULE | Freq: Two times a day (BID) | ORAL | 0 refills | Status: AC

## 2023-03-12 NOTE — Discharge Instructions (Signed)
You were seen today for continued cough.  I am worried about a possible pneumonia on your xray.  I have sent out an antibiotic to treat this.  If the radiologist reads this differently you will be notified.  You will also see this on mychart.  Please return if not improving or worsening.

## 2023-03-12 NOTE — ED Triage Notes (Signed)
Pt reports productive cough x6 weeks. Yellow sputum. Noticed small amounts of blood in sputum x3 weeks.

## 2023-03-12 NOTE — ED Provider Notes (Signed)
[facility]    CSN: [id number] Arrival date & time: 03/12/23  4098      History   Chief Complaint Chief Complaint  Patient presents with   Cough    HPI James Delacruz is a 42 y.o. male.    Cough Associated symptoms: no shortness of breath and no wheezing    Patient is here for a cough x 6 weeks.  It was worse initially the first 2 weeks, but has lingered.  No fevers.  He does have h/o pneumonia in the past with lingering cough.  He is using flonase and saline solution.  No runny nose, congestion.        Past Medical History:  Diagnosis Date   Kidney stones     Patient Active Problem List   Diagnosis Date Noted   Plantar fasciitis 11/13/2016   Internal derangement of right knee 08/20/2015   Primary osteoarthritis of right knee 08/20/2015   Pain in unspecified knee 10/23/2012   ACL tear 05/17/2012   Medial meniscus tear 05/17/2012    History reviewed. No pertinent surgical history.     Home Medications    Prior to Admission medications   Medication Sig Start Date End Date Taking? Authorizing Provider  Multiple Vitamins-Minerals (ZINC PO) Take 1 tablet by mouth daily.   Yes [provider]  amoxicillin (AMOXIL) 500 MG tablet Take 500 mg by mouth 2 (two) times daily. Patient not taking: Reported on 03/12/2023    [provider]  cholecalciferol 25 MCG (1000 UT) tablet Take 1,000 Units by mouth daily. Patient not taking: Reported on 03/12/2023    [provider]  ondansetron (ZOFRAN) 4 MG tablet Take 1 tablet (4 mg total) by mouth every 6 (six) hours. Patient not taking: Reported on 03/12/2023 08/16/22   Carroll Sage, PA-C  predniSONE (DELTASONE) 20 MG tablet Take 3 tabs PO daily x 5 days. Patient not taking: Reported on 07/27/2022 04/05/22   Trevor Iha, FNP  promethazine-dextromethorphan (PROMETHAZINE-DM) 6.25-15 MG/5ML syrup Take 5 mLs by mouth 2 (two) times daily as needed for cough. Patient not taking:  Reported on 07/27/2022 04/05/22   Trevor Iha, FNP    Family History Family History  Problem Relation Age of Onset   Healthy Mother    Healthy Father     Social History Social History   Tobacco Use   Smoking status: Never   Smokeless tobacco: Never  Vaping Use   Vaping status: Never Used  Substance Use Topics   Alcohol use: No   Drug use: Never     Allergies   Patient has no known allergies.   Review of Systems Review of Systems  Constitutional: Negative.   HENT: Negative.    Respiratory:  Positive for cough. Negative for shortness of breath and wheezing.   Cardiovascular: Negative.   Gastrointestinal: Negative.   Genitourinary: Negative.   Musculoskeletal: Negative.   Psychiatric/Behavioral: Negative.       Physical Exam Triage Vital Signs ED Triage Vitals  Encounter Vitals Group     BP 03/12/23 1105 116/76     Systolic BP Percentile --      Diastolic BP Percentile --      Pulse Rate 03/12/23 1105 71     Resp 03/12/23 1105 16     Temp 03/12/23 1105 98.2 F (36.8 C)     Temp Source 03/12/23 1105 Oral     SpO2 03/12/23 1105 95 %     Weight 03/12/23 1106 211 lb (95.7  kg)     Height 03/12/23 1106 6\' 1"  (1.854 m)     Head Circumference --      Peak Flow --      Pain Score 03/12/23 1106 1     Pain Loc --      Pain Education --      Exclude from Growth Chart --    No data found.  Updated Vital Signs BP 116/76 (BP Location: Left Arm)   Pulse 71   Temp 98.2 F (36.8 C) (Oral)   Resp 16   Ht 6\' 1"  (1.854 m)   Wt 95.7 kg   SpO2 95%   BMI 27.84 kg/m   Visual Acuity Right Eye Distance:   Left Eye Distance:   Bilateral Distance:    Right Eye Near:   Left Eye Near:    Bilateral Near:     Physical Exam Constitutional:      Appearance: Normal appearance.  HENT:     Nose: Nose normal.  Cardiovascular:     Rate and Rhythm: Normal rate and regular rhythm.  Pulmonary:     Effort: Pulmonary effort is normal.     Breath sounds: Normal breath  sounds.  Musculoskeletal:     Cervical back: Normal range of motion and neck supple. No tenderness.  Lymphadenopathy:     Cervical: No cervical adenopathy.  Skin:    General: Skin is warm.  Neurological:     General: No focal deficit present.     Mental Status: He is alert.  Psychiatric:        Mood and Affect: Mood normal.      UC Treatments / Results  Labs (all labs ordered are listed, but only abnormal results are displayed) Labs Reviewed - No data to display  EKG   Radiology No results found.  Procedures Procedures (including critical care time)  Medications Ordered in UC Medications - No data to display  Initial Impression / Assessment and Plan / UC Course  I have reviewed the triage vital signs and the nursing notes.  Pertinent labs & imaging results that were available during my care of the patient were reviewed by me and considered in my medical decision making (see chart for details).   Final Clinical Impressions(s) / UC Diagnoses   Final diagnoses:  Pneumonia due to infectious organism, unspecified laterality, unspecified part of lung     Discharge Instructions      You were seen today for continued cough.  I am worried about a possible pneumonia on your xray.  I have sent out an antibiotic to treat this.  If the radiologist reads this differently you will be notified.  You will also see this on mychart.  Please return if not improving or worsening.     ED Prescriptions     Medication Sig Dispense Auth. Provider   doxycycline (VIBRAMYCIN) 100 MG capsule Take 1 capsule (100 mg total) by mouth 2 (two) times daily. 20 capsule Jannifer Franklin, MD      PDMP not reviewed this encounter.   Jannifer Franklin, MD 03/12/23 [phone]

## 2023-03-12 NOTE — Telephone Encounter (Signed)
Spoke with patient in regards to normal results.  Advised that he may start the doxy for presumed bronchitis instead.  He is aware.

## 2023-03-12 NOTE — ED Notes (Signed)
Called patient to come back to room/note states he is waiting in his car.  Pt answered his phone and stated that he had to leave for a minute because he vomited.

## 2023-04-25 ENCOUNTER — Ambulatory Visit
Admission: EM | Admit: 2023-04-25 | Discharge: 2023-04-25 | Disposition: A | Discharge status: Home / Self Care | Payer: Federal, State, Local not specified - PPO

## 2023-04-25 DIAGNOSIS — J329 Chronic sinusitis, unspecified: Secondary | ICD-10-CM

## 2023-04-25 DIAGNOSIS — J40 Bronchitis, not specified as acute or chronic: Secondary | ICD-10-CM | POA: Diagnosis not present

## 2023-04-25 NOTE — ED Provider Notes (Signed)
 [facility]    CSN: [id number] Arrival date & time: 04/25/23  1306      History   Chief Complaint Chief Complaint  Patient presents with   Sinus Problem    Family of 2    HPI James Delacruz is a 43 y.o. male.   Patient here today for evaluation of sinus congestion.  He reports he is concerned for possible sinus infection developing.  He notes that he has had a cough for several months and that he was having more sinus pressure however after he was seen for same several weeks ago symptoms resolved and he did not take antibiotics prescribed.  He questions if doxycycline  would be appropriate to start now.  He has not had any fever.  The history is provided by the patient.  Sinus Problem Pertinent negatives include no abdominal pain and no shortness of breath.    Past Medical History:  Diagnosis Date   Kidney stones     Patient Active Problem List   Diagnosis Date Noted   Bilateral impacted cerumen 08/25/2021   Gastroesophageal reflux disease 08/19/2021   Sensorineural hearing loss (SNHL) of both ears 08/19/2021   Plantar fasciitis 11/13/2016   Internal derangement of right knee 08/20/2015   Primary osteoarthritis of right knee 08/20/2015   Stiffness of right knee 08/20/2015   Pain in unspecified knee 10/23/2012   Knee pain 10/23/2012   ACL tear 05/17/2012   Medial meniscus tear 05/17/2012    History reviewed. No pertinent surgical history.     Home Medications    Prior to Admission medications   Medication Sig Start Date End Date Taking? Authorizing Provider  predniSONE  (DELTASONE ) 20 MG tablet Take 20 mg by mouth as directed. 08/19/21  Yes [provider]  tamsulosin  (FLOMAX ) 0.4 MG CAPS capsule Take 0.4 mg by mouth daily. 12/31/22  Yes [provider]  doxycycline  (VIBRAMYCIN ) 100 MG capsule Take 1 capsule (100 mg total) by mouth 2 (two) times daily. Patient taking differently: Take 100 mg by mouth 2 (two) times daily. Did not take it.  03/12/23   Piontek, Rocky, MD  Multiple Vitamins-Minerals (ZINC PO) Take 1 tablet by mouth daily.    [provider]    Family History Family History  Problem Relation Age of Onset   Healthy Mother    Healthy Father     Social History Social History   Tobacco Use   Smoking status: Never    Passive exposure: Never   Smokeless tobacco: Never  Vaping Use   Vaping status: Never Used  Substance Use Topics   Alcohol use: No   Drug use: Never     Allergies   Patient has no known allergies.   Review of Systems Review of Systems  Constitutional:  Negative for chills and fever.  HENT:  Positive for congestion and sore throat. Negative for ear pain.   Eyes:  Negative for discharge and redness.  Respiratory:  Positive for cough. Negative for shortness of breath.   Gastrointestinal:  Negative for abdominal pain, nausea and vomiting.     Physical Exam Triage Vital Signs ED Triage Vitals  Encounter Vitals Group     BP 04/25/23 1425 113/74     Systolic BP Percentile --      Diastolic BP Percentile --      Pulse Rate 04/25/23 1425 80     Resp 04/25/23 1425 18     Temp 04/25/23 1425 98 F (36.7 C)  Temp Source 04/25/23 1425 Oral     SpO2 04/25/23 1425 96 %     Weight 04/25/23 1423 210 lb (95.3 kg)     Height 04/25/23 1423 6' 1 (1.854 m)     Head Circumference --      Peak Flow --      Pain Score 04/25/23 1422 0     Pain Loc --      Pain Education --      Exclude from Growth Chart --    No data found.  Updated Vital Signs BP 113/74 (BP Location: Left Arm)   Pulse 80   Temp 98 F (36.7 C) (Oral)   Resp 18   Ht 6' 1 (1.854 m)   Wt 210 lb (95.3 kg)   SpO2 96%   BMI 27.71 kg/m   Visual Acuity Right Eye Distance:   Left Eye Distance:   Bilateral Distance:    Right Eye Near:   Left Eye Near:    Bilateral Near:     Physical Exam Vitals and nursing note reviewed.  Constitutional:      General: He is not in acute distress.    Appearance:  Normal appearance. He is not ill-appearing.  HENT:     Head: Normocephalic and atraumatic.     Nose: Congestion present.     Mouth/Throat:     Mouth: Mucous membranes are moist.     Pharynx: Oropharynx is clear. No oropharyngeal exudate or posterior oropharyngeal erythema.  Eyes:     Conjunctiva/sclera: Conjunctivae normal.  Cardiovascular:     Rate and Rhythm: Normal rate and regular rhythm.     Heart sounds: Normal heart sounds. No murmur heard. Pulmonary:     Effort: Pulmonary effort is normal. No respiratory distress.     Breath sounds: Normal breath sounds. No wheezing, rhonchi or rales.  Skin:    General: Skin is warm and dry.  Neurological:     Mental Status: He is alert.  Psychiatric:        Mood and Affect: Mood normal.        Thought Content: Thought content normal.      UC Treatments / Results  Labs (all labs ordered are listed, but only abnormal results are displayed) Labs Reviewed - No data to display  EKG   Radiology No results found.  Procedures Procedures (including critical care time)  Medications Ordered in UC Medications - No data to display  Initial Impression / Assessment and Plan / UC Course  I have reviewed the triage vital signs and the nursing notes.  Pertinent labs & imaging results that were available during my care of the patient were reviewed by me and considered in my medical decision making (see chart for details).    Suspect sinobronchitis and recommended he proceed with doxycycline  treatment.  Offered prednisone  however patient reports he does not typically do well with steroids and is concerned because of his kidneys and prior kidney stones.  Recommended he continue to monitor symptoms and he will follow-up if no gradual improvement or with any further concerns.  Final Clinical Impressions(s) / UC Diagnoses   Final diagnoses:  Sinobronchitis   Discharge Instructions   None    ED Prescriptions   None    PDMP not reviewed  this encounter.   Billy Asberry FALCON, PA-C 04/25/23 1540

## 2023-04-25 NOTE — ED Triage Notes (Addendum)
"  I think I have another sinus infection, symptoms started about 45 days ago with Cough, Treated about 2 and 1/2 wks ago (but didn't treat oral medication) with oral antibiotics but not improved". No fever.

## 2023-06-06 IMAGING — DX DG CHEST 2V
2 series · 2 of 2 positions shown · non-contrast
Comparison: 05/16/2020

CLINICAL DATA: Cough over the last 2 months

EXAM:
CHEST - 2 VIEW

[chest pa]
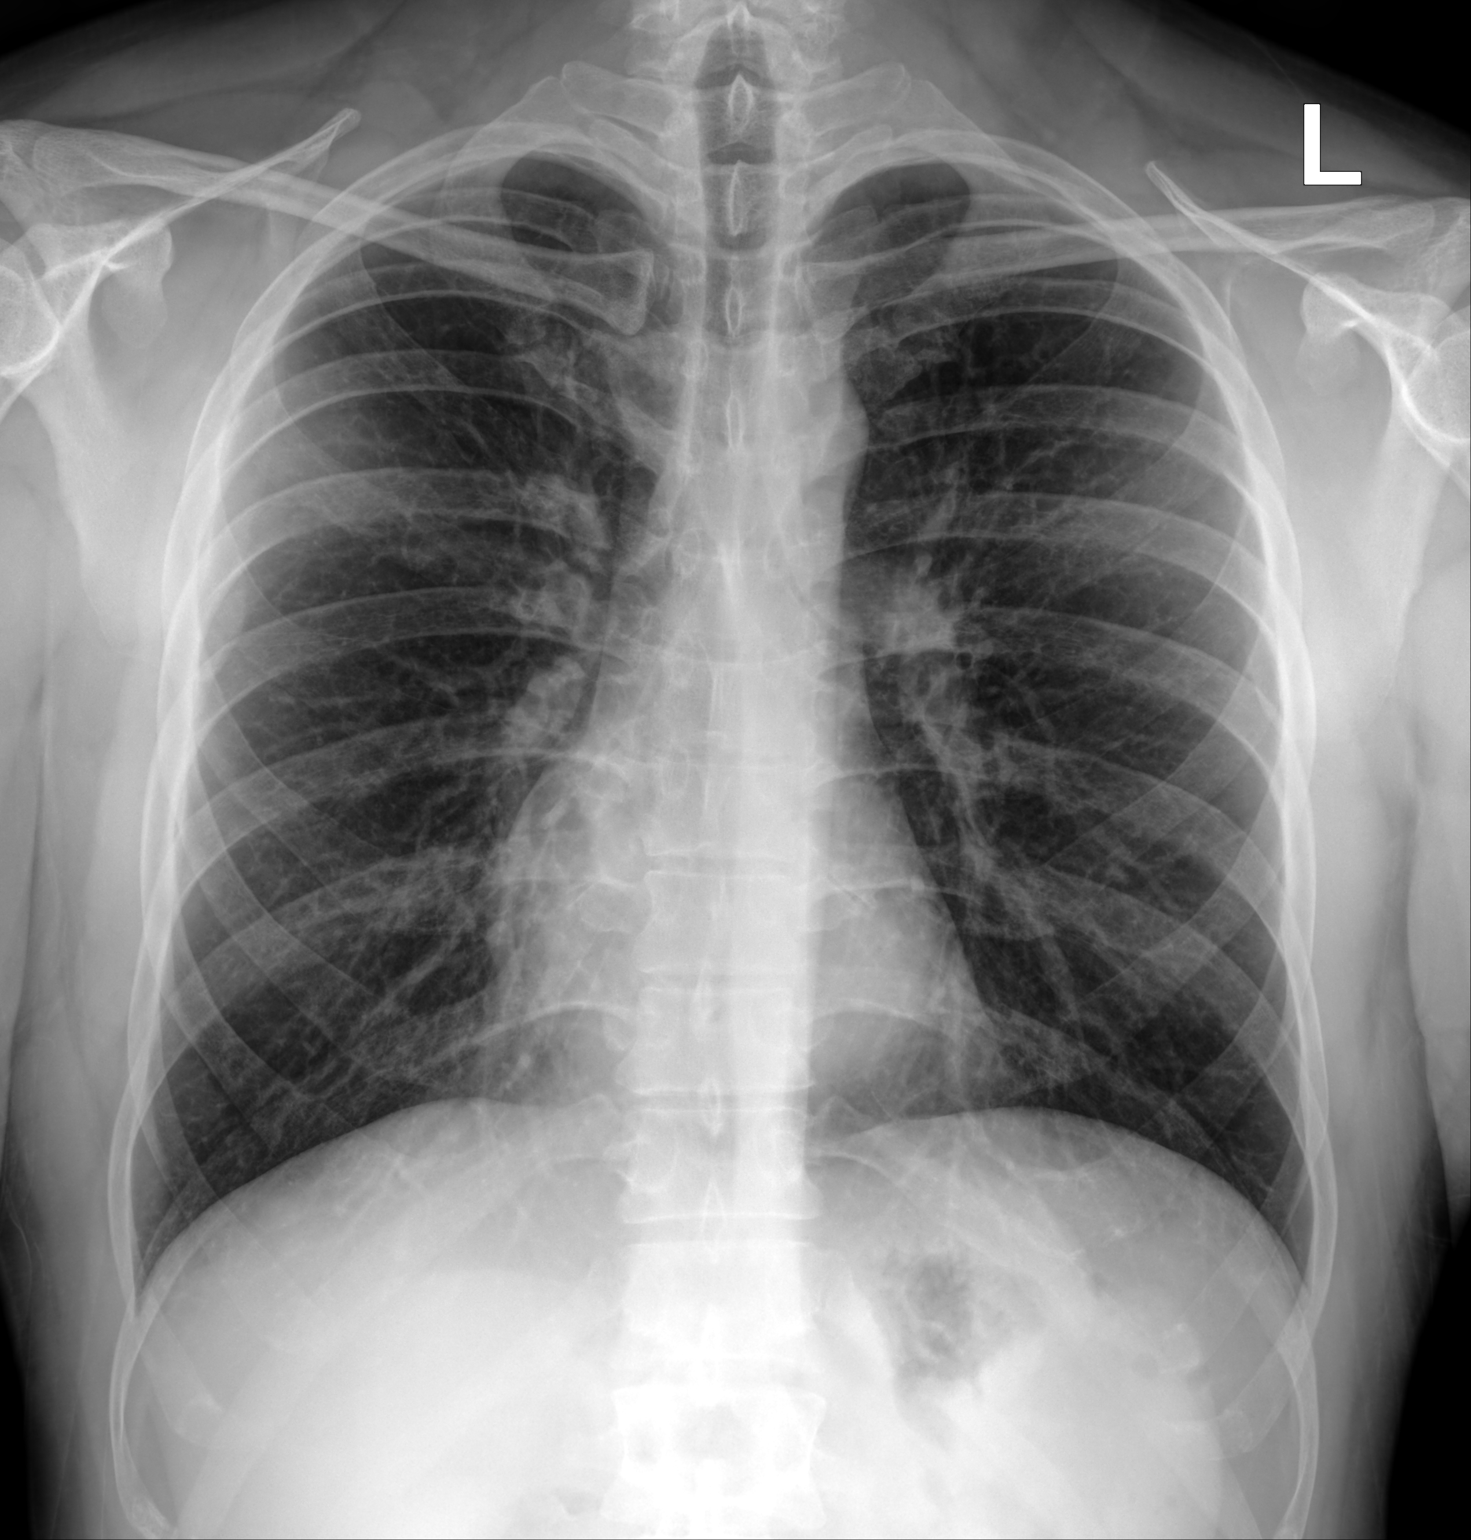

[chest lat]
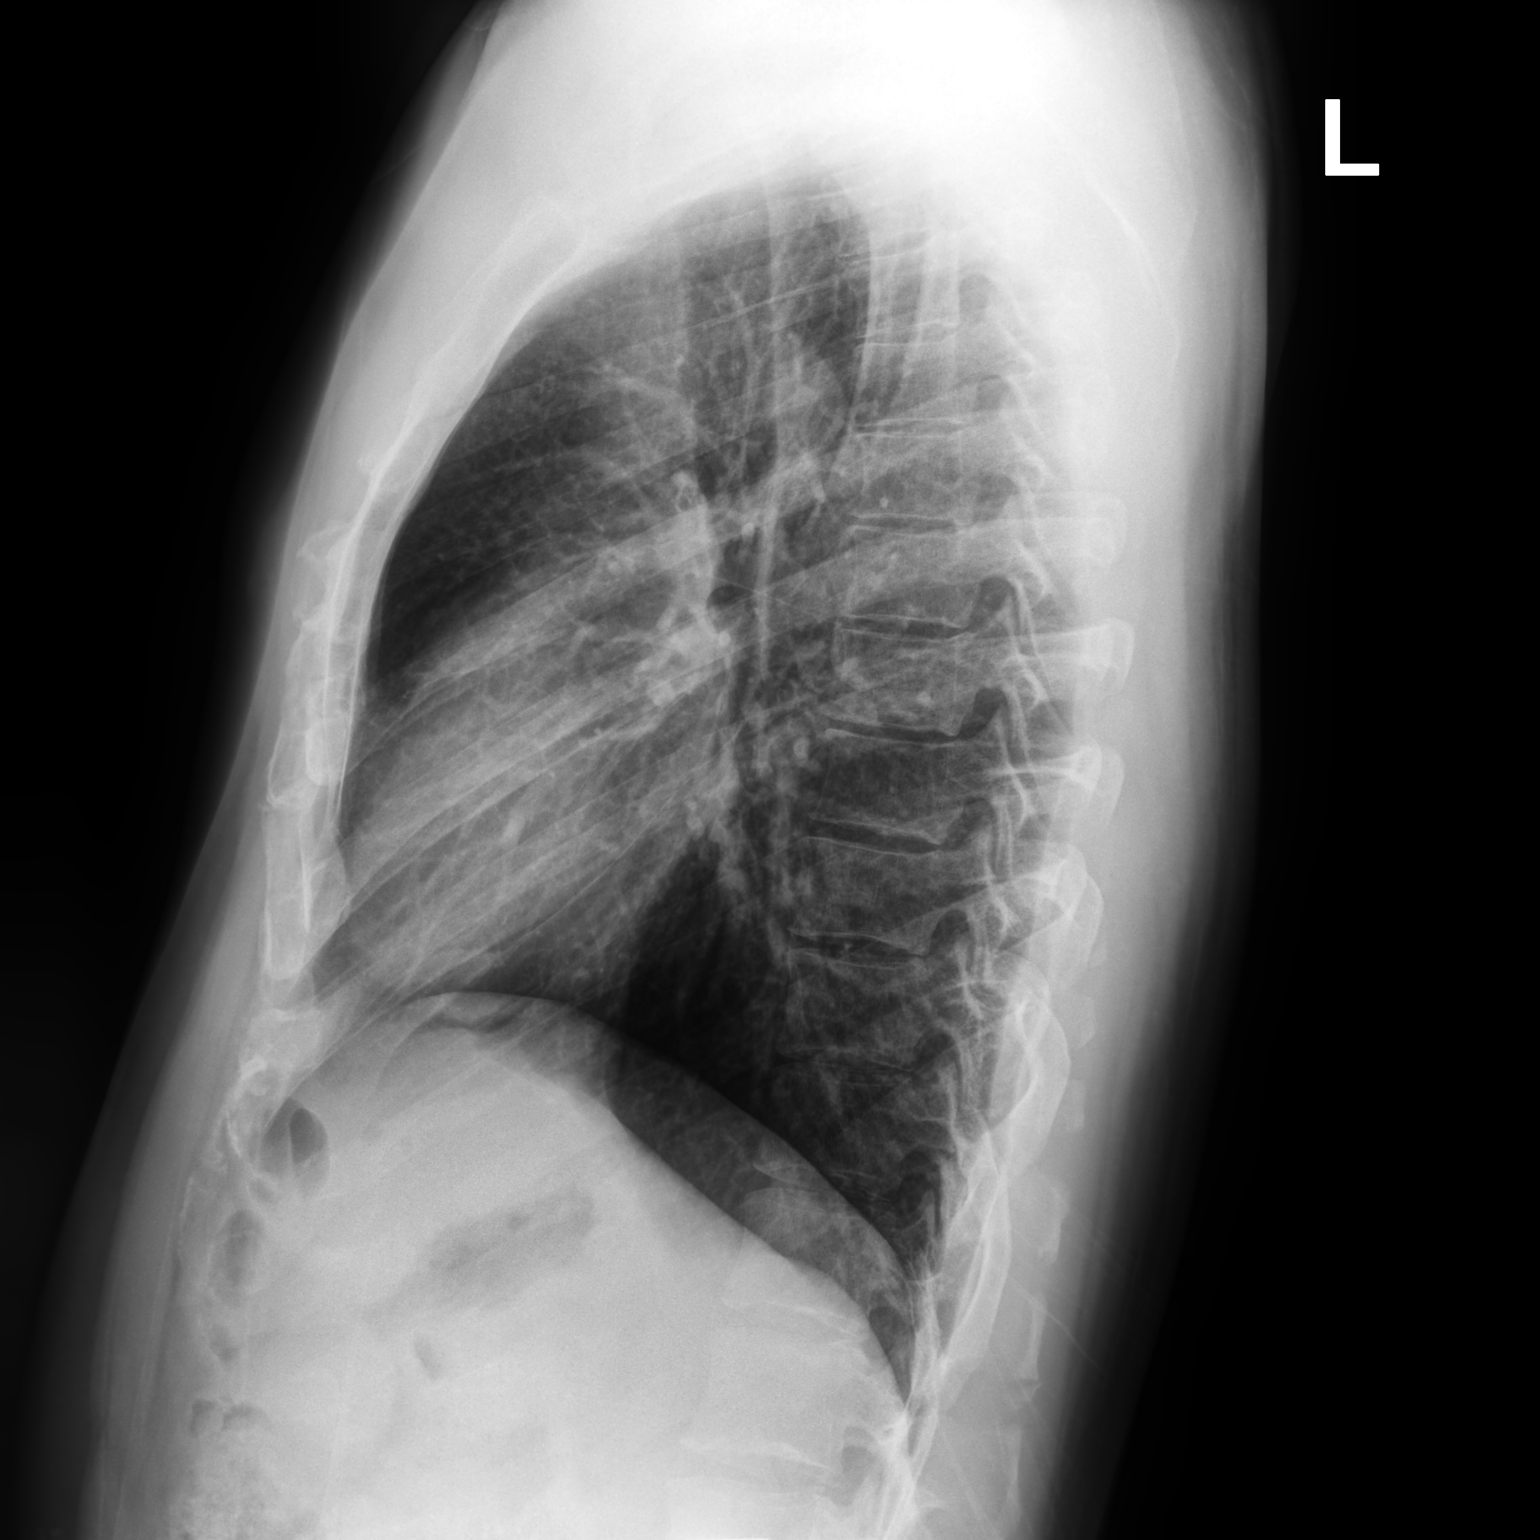

[2 of 2 positions shown; findings below may reference images not displayed]

FINDINGS: Chronic mild prominence the right heart border.

Hazy density in the right upper chest appears to be relatively
symmetric and probably due to soft tissues of the chest wall rather
than a pulmonary abnormality. The streaky opacity in this vicinity
shown on the prior exam has resolved.

The lungs appear otherwise clear. No blunting of the costophrenic
angles. No substantial bony abnormality observed.
IMPRESSION: 1. No significant intrapulmonary abnormality is currently
identified.
2. Chronic mild prominence the right atrial border, which can
sometimes be associated with valvular disease, atrial septal defect,
or raised right ventricular pressures-consider echocardiography.
